# Patient Record
Sex: Female | Born: 1985 | Race: White | Hispanic: No | Marital: Single | Smoking: Former smoker
Health system: Southern US, Community
[De-identification: ages and names within clinical notes are randomized; demographics above are authoritative.]

## PROBLEM LIST (undated history)

## (undated) DIAGNOSIS — Z8619 Personal history of other infectious and parasitic diseases: Secondary | ICD-10-CM

## (undated) DIAGNOSIS — R87619 Unspecified abnormal cytological findings in specimens from cervix uteri: Secondary | ICD-10-CM

## (undated) DIAGNOSIS — B977 Papillomavirus as the cause of diseases classified elsewhere: Secondary | ICD-10-CM

## (undated) DIAGNOSIS — F32A Depression, unspecified: Secondary | ICD-10-CM

## (undated) DIAGNOSIS — R51 Headache: Secondary | ICD-10-CM

## (undated) DIAGNOSIS — F329 Major depressive disorder, single episode, unspecified: Secondary | ICD-10-CM

## (undated) DIAGNOSIS — IMO0002 Reserved for concepts with insufficient information to code with codable children: Secondary | ICD-10-CM

## (undated) DIAGNOSIS — F419 Anxiety disorder, unspecified: Secondary | ICD-10-CM

## (undated) DIAGNOSIS — K649 Unspecified hemorrhoids: Secondary | ICD-10-CM

## (undated) DIAGNOSIS — E119 Type 2 diabetes mellitus without complications: Secondary | ICD-10-CM

## (undated) DIAGNOSIS — Z3490 Encounter for supervision of normal pregnancy, unspecified, unspecified trimester: Secondary | ICD-10-CM

## (undated) DIAGNOSIS — K5900 Constipation, unspecified: Secondary | ICD-10-CM

## (undated) DIAGNOSIS — M5030 Other cervical disc degeneration, unspecified cervical region: Secondary | ICD-10-CM

## (undated) HISTORY — DX: Depression, unspecified: F32.A

## (undated) HISTORY — DX: Encounter for supervision of normal pregnancy, unspecified, unspecified trimester: Z34.90

## (undated) HISTORY — DX: Type 2 diabetes mellitus without complications: E11.9

## (undated) HISTORY — DX: Unspecified hemorrhoids: K64.9

## (undated) HISTORY — DX: Anxiety disorder, unspecified: F41.9

## (undated) HISTORY — DX: Major depressive disorder, single episode, unspecified: F32.9

## (undated) HISTORY — PX: NO PAST SURGERIES: SHX2092

## (undated) HISTORY — DX: Reserved for concepts with insufficient information to code with codable children: IMO0002

## (undated) HISTORY — DX: Headache: R51

## (undated) HISTORY — DX: Constipation, unspecified: K59.00

## (undated) HISTORY — DX: Unspecified abnormal cytological findings in specimens from cervix uteri: R87.619

## (undated) HISTORY — DX: Papillomavirus as the cause of diseases classified elsewhere: B97.7

## (undated) HISTORY — DX: Personal history of other infectious and parasitic diseases: Z86.19

---
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
## 2004-08-11 ENCOUNTER — Emergency Department (HOSPITAL_COMMUNITY): Admission: EM | Admit: 2004-08-11 | Discharge: 2004-08-11 | Payer: Self-pay | Admitting: Emergency Medicine

## 2005-11-05 ENCOUNTER — Emergency Department (HOSPITAL_COMMUNITY): Admission: EM | Admit: 2005-11-05 | Discharge: 2005-11-06 | Payer: Self-pay | Admitting: Emergency Medicine

## 2005-11-07 ENCOUNTER — Emergency Department (HOSPITAL_COMMUNITY): Admission: EM | Admit: 2005-11-07 | Discharge: 2005-11-07 | Payer: Self-pay | Admitting: *Deleted

## 2006-02-15 ENCOUNTER — Ambulatory Visit: Payer: Self-pay | Admitting: *Deleted

## 2006-02-15 ENCOUNTER — Encounter: Payer: Self-pay | Admitting: Obstetrics & Gynecology

## 2006-05-04 ENCOUNTER — Ambulatory Visit: Payer: Self-pay | Admitting: Obstetrics and Gynecology

## 2006-07-19 ENCOUNTER — Ambulatory Visit: Payer: Self-pay | Admitting: Obstetrics & Gynecology

## 2006-10-06 ENCOUNTER — Ambulatory Visit: Payer: Self-pay | Admitting: Family Medicine

## 2006-12-01 ENCOUNTER — Emergency Department (HOSPITAL_COMMUNITY): Admission: EM | Admit: 2006-12-01 | Discharge: 2006-12-01 | Payer: Self-pay | Admitting: Emergency Medicine

## 2006-12-29 ENCOUNTER — Encounter: Payer: Self-pay | Admitting: Family Medicine

## 2006-12-29 ENCOUNTER — Ambulatory Visit: Payer: Self-pay | Admitting: Family Medicine

## 2006-12-29 ENCOUNTER — Encounter (INDEPENDENT_AMBULATORY_CARE_PROVIDER_SITE_OTHER): Payer: Self-pay | Admitting: *Deleted

## 2007-02-15 ENCOUNTER — Ambulatory Visit: Payer: Self-pay | Admitting: *Deleted

## 2007-02-15 ENCOUNTER — Encounter: Payer: Self-pay | Admitting: Obstetrics & Gynecology

## 2007-02-15 ENCOUNTER — Other Ambulatory Visit: Admission: RE | Admit: 2007-02-15 | Discharge: 2007-02-15 | Payer: Self-pay | Admitting: Obstetrics & Gynecology

## 2007-02-23 ENCOUNTER — Emergency Department (HOSPITAL_COMMUNITY): Admission: EM | Admit: 2007-02-23 | Discharge: 2007-02-23 | Payer: Self-pay | Admitting: Emergency Medicine

## 2007-03-01 ENCOUNTER — Ambulatory Visit: Payer: Self-pay | Admitting: Obstetrics and Gynecology

## 2007-09-13 HISTORY — PX: COLPOSCOPY: SHX161

## 2008-06-23 ENCOUNTER — Emergency Department (HOSPITAL_COMMUNITY): Admission: EM | Admit: 2008-06-23 | Discharge: 2008-06-23 | Payer: Self-pay | Admitting: Emergency Medicine

## 2008-07-01 ENCOUNTER — Other Ambulatory Visit: Admission: RE | Admit: 2008-07-01 | Discharge: 2008-07-01 | Payer: Self-pay | Admitting: Unknown Physician Specialty

## 2008-07-01 ENCOUNTER — Encounter (INDEPENDENT_AMBULATORY_CARE_PROVIDER_SITE_OTHER): Payer: Self-pay | Admitting: Unknown Physician Specialty

## 2008-09-08 LAB — US OB TRANSVAGINAL

## 2008-12-15 LAB — US OB COMP + 14 WK

## 2010-03-04 ENCOUNTER — Emergency Department (HOSPITAL_COMMUNITY): Admission: EM | Admit: 2010-03-04 | Discharge: 2010-03-04 | Payer: Self-pay | Admitting: Emergency Medicine

## 2010-09-23 LAB — US OB COMP + 14 WK

## 2010-12-09 LAB — US OB FOLLOW UP

## 2011-01-28 NOTE — H&P (Signed)
[facility]  Patient:    MADISIN, HASAN Visit Number: [id number] MRN: [id number]          Service Type: OBS Location: 4A A415 01 Attending Physician:  Jeri Cos. Dictated by:   Christin Bach, M.D. Admit Date:  11/09/2001 Discharge Date: 11/09/2001   CC:         Langley Gauss, M.D.   History and Physical  OBSERVATION NOTE  PERFORMED: November 09, 2001  ADMITTED:  2:49 p.m.  DISCHARGED:  4:49 p.m.  OBSERVATION TIME:  Two hours.  CHIEF COMPLAINT:  Back pain, mild dysuria, questionable wet panties.  HISTORY OF PRESENT ILLNESS:  A 25 year old gravida 2, para 1 followed in Dr. Ferne Coe office for pregnancy care, now in second trimester who presents after calling in complaining of back discomfort and a single episode of wetting herself this a.m., question of membrane rupture, unable to be clarified further. The patient presents where an external monitoring shows an excellent reactive fetal heart rate tracing and no evidence of uterine contractions. Nitrazine test is negative. Back exam shows nonspecific musculoskeletal discomfort equal in all areas of the back from rib cage to posterior superior iliac crest. There is no specific CVA tenderness. Urinalysis on dipstick shows trace leukocytes, is negative for nitrites, protein, blood, etc.  PLAN: 1. Treat nonspecific musculoskeletal discomfort with Tylenol No. 3 x 15    tablets. The patient given prescription. 2. Prescription Macrobid 100 b.i.d. x 7 days. 3. Follow-up urine C&S which is sent to lab. Dictated by:   Christin Bach, M.D. Attending Physician:  Jeri Cos. DD:  11/09/01 TD:  11/09/01 Job: [id number]

## 2011-01-28 NOTE — Discharge Summary (Signed)
[facility]  Patient:    DENNETTE, FAULCONER Visit Number: [id number] MRN: [id number]          Service Type: OBS Location: 4A A427 01 Attending Physician:  Jeri Cos. Dictated by:   Langley Gauss, M.D. Admit Date:  12/31/2001 Discharge Date: 01/01/2002   CC:         [facility]   Discharge Summary  DIAGNOSES: 1. A 38 1/2 week intrauterine pregnancy. 2. Spontaneous rupture of membranes presenting in spontaneous labor.  PROCEDURES PERFORMED: 1. Spontaneous assisted vaginal delivery of 7 pounds 13 ounces female infant. 2. Midline episiotomy repair.  DISPOSITION:  Patient is given a prescription for Tylenol No.3 at time of discharge.  She is bottle feeding.  She will return visit in four weeks time to discuss birth control options.  LABORATORIES:  Hemoglobin and hematocrit admission of 10.1/27.9, postpartum day #1 10.1/25.0 with a white count of 16.9.  HOSPITAL COURSE:  Patient presented the early a.m. of December 31, 2001 in the early stages of active labor.  Initial examination by the nursing staff report was that patient was 4-5 cm dilated.  After admission performed patient requested epidural analgesic.  However, when I presented to place the epidural and evaluate the patient she was noted to be 9 cm dilated, completely effaced, 0 station, vertex presentation.  Patient began having an urge to push.  Thus, she was placed in the dorsal lithotomy position, prepped and draped in the usual sterile manner.  Patient pushed well during a short second stage of labor.  Lidocaine 20 cc 1% used in the midline perineal body.  Small midline episiotomy was performed.  The infant was then noted to deliver in a well controlled atraumatic manner in a direct OA position over this midline episiotomy without extension.  Mouth and nares of the infant were bulb suctioned of clear amniotic fluid.  Renewed expulsive efforts resulted in a spontaneous  rotation to a left anterior shoulder position.  Delivery itself was uncomplicated on December 31, 2001.  Patient did well postpartum with no postpartum complications.  She remained afebrile.  Bonded well with the infant and was discharged to home on January 01, 2002.  Patient is given a copy of the standardized discharge instructions at time of discharge.  Pediatric care was provided by [facility]. Dictated by:   Langley Gauss, M.D. Attending Physician:  Jeri Cos. DD:  01/03/02 TD:  01/04/02 Job: [id number]

## 2011-01-28 NOTE — Op Note (Signed)
[facility]  Patient:    Lynn Spencer, Lynn Spencer Visit Number: [id number] MRN: [id number]          Service Type: OBS Location: 4A A427 01 Attending Physician:  Jeri Cos. Dictated by:   Langley Gauss, M.D. Proc. Date: 12/31/01 Admit Date:  12/31/2001                             Operative Report  DELIVERY NOTE  DIAGNOSES:  1. 38 1/2 week intrauterine pregnancy.  2. Spontaneous rupture of membranes.  PROCEDURE:  1. Spontaneous cystovaginal delivery 6 pound 14 ounce female infant.  2. Midline episiotomy repair.  DELIVERY PERFORMED BY:  Langley Gauss, M.D.  COMPLICATIONS:  None.  SPECIMENS:  Arterial cord gas and cord blood to pathology. The placenta is examined and noted to be apparently intact with a three vessel umbilical cord.  ANALGESIA FOR DELIVERY:  The patient received IV narcotics only.  SUMMARY:  The patient is a gravida 2, para 1 who presented to [facility] with spontaneous rupture of membranes and noted to be in active labor. At initial examination by the nursing staff, she was reported to be 4-5 cm dilated and actively contracting with obvious evidence of spontaneous rupture of membranes. The patient was noted to have a reassuring fetal heart rate and regular uterine contractions. After appropriate admission procedures performed, request was made for epidural analgesic. When I arrived to place the epidural, the patient was having significant pelvic and perineal pressure at that time. She was examined by the nursing staff and noted to be 8 cm dilated. The patient has a history of very short second stage of labor with previous labor and delivery, thus it was felt that placement of the epidural could possibly interfere with the delivery; thus the patient was followed very closely with preparations for which was felt might be a very precipitous delivery. The patient thereafter did reach 9+ cm dilatation and she had  a strong urge to push. However, despite the excellent coaching efforts by the nursing staff as well as her mother, the patient was noted to be a very poor pushing candidate with very poor and ineffectual efforts frequently screaming and being somewhat antagonist and counter productive during the second stage of labor. The patient, however, was managed aggressively in attempts to get her to push. There was a point in time with the vertex at a +2 station and ineffectual pushing efforts, we planned on placing the epidural analgesic which may have facilitated the second stage of labor and allowed a vacuum assisted delivery as needed. However just as plans were being made to proceed with this placement, the patient began to push very well resulting in crowning of the infants vertex on the perineum. Thus she was placed in the dorsal lithotomy position, prepped and draped in the usual sterile manner, 10 cc of 1% lidocaine injected in the midline of the perineal body. With distention of the perineum, a small midline episiotomy was performed. The infant is then noted to deliver in a well controlled direct OA position over this midline episiotomy without extension. The mouth and nares of the infant were bulb suctioned of clear amniotic fluid. Rate of expulsive efforts resolve spontaneous rotation to a left anterior shoulder position. Combined pushing efforts with gentle abdominal traction resulted in delivery of the shoulder and pubic symphysis without difficulty. The umbilical cord was then milked towards the infant, cord was clamped and cut  and infant was placed on the maternal abdomen for immediate bonding purposes. Arterial cord gas and cord blood are obtained from the umbilical cord. Gentle traction of the umbilical cord results in separation which upon examination appears to be an intact placenta with a three vessel umbilical cord. Examination of the genital tract reveals two small superficial  periurethral lacerations bilaterally which were not bleeding and thus did not require repair. The midline episiotomy is noted to have not extended. The midline episiotomy is easily repaired utilizing #0 chromic in a running locked fashion on the vaginal mucosa followed by two layer closure of #0 chromic on the perineal body. The patient tolerated this repair very well. She did receive additional lidocaine such that a total of 30 cc of 1% lidocaine was utilized in the perineal body for episiotomy and episiotomy repair. The patient was then taken out of the dorsal lithotomy position and allowed to bond with the infant. The mother and infant both doing very well following delivery. Total estimated blood loss was less than 500 cc. Dictated by:   Langley Gauss, M.D. Attending Physician:  Jeri Cos. DD:  12/31/01 TD:  01/01/02 Job: [phone] [id number]

## 2011-01-28 NOTE — H&P (Signed)
[facility]  Patient:    Lynn Spencer, Lynn Spencer Visit Number: [id number] MRN: [id number]          Service Type: OBS Location: 4A A427 01 Attending Physician:  Jeri Cos. Dictated by:   Langley Gauss, M.D. Admit Date:  12/31/2001 Discharge Date: 01/01/2002                           History and Physical  HISTORY OF PRESENT ILLNESS:  [age]-year-old gravida 2, para 1 at 38-1/[redacted] weeks gestation, who presents to [facility] in early a.m. complaining of spontaneous rupture of membranes at home in bed with findings of clear amniotic fluid.  The patient presented to labor and delivery in obvious active labor.  The patients prenatal course has been uncomplicated. She has A-positive blood type.  AFP was normal.  GTT was normal during her previous pregnancy, thus was not repeated during this pregnancy.  OBSTETRICAL HISTORY:  Pertinent for vaginal delivery x1, 6-pound 7-ounce female infant, utilizing epidural analgesic.  That delivery was complicated only by a fourth degree extension of the midline episiotomy which was repaired appropriately and has resulted in no residual functional or anatomic defect.  DRUG ALLERGIES:  No known drug allergies.  CURRENT MEDICATIONS:  Prenatal vitamins only.  PHYSICAL EXAMINATION:  GENERAL:  A pleasant white female.  Height 5 foot 6 inches.  Weight is 146 pounds.  VITAL SIGNS:  Blood pressure 130/89, pulse rate of 98, respiratory rate is 20.  HEENT:  Negative.  No adenopathy.  NECK:  Supple.  Thyroid is nonpalpable.  LUNGS:  Clear.  CARDIOVASCULAR:  Regular rate and rhythm.  ABDOMEN:  Soft and nontender.  No surgical scars are identified.  She is vertex presentation by Leopolds maneuvers with a fundal height of 36 cm. External fetal monitor reveals uterine contractions occurring about every three minutes, moderate intensity.  Reassuring fetal heart rate is identified.  EXTREMITIES:  Noted to be  normal.  PELVIC:  Normal external genitalia.  No lesions or ulcerations identified. There is clear amniotic fluid pooling and leakage noted vaginally.  Sterile digital examination, initial examination by the nursing staff, 4- to 5-cm dilated, vertex presentation.  After laboring very rapidly and in very strong labor, the patient was examined and noted to be 9-cm dilated, completely effaced, 0 station, vertex presentation.  Clear amniotic fluid is noted.  ASSESSMENT:  Gravida 2, para 1, 38-1/[redacted] weeks gestation with very rapid progression from 4 to 5 cm to 9-cm dilatation.  Patient admitted and will be prepared for imminent delivery.  The patient had required epidural analgesic, however, at this advanced stage of labor, it is felt that placement of the epidural may be impossible as delivery may occur before epidural placement completed. Dictated by:   Langley Gauss, M.D. Attending Physician:  Jeri Cos. DD:  01/03/02 TD:  01/04/02 Job: [id number]

## 2011-01-28 NOTE — Consult Note (Signed)
[facility]  Patient:    Lynn Spencer, Lynn Spencer Visit Number: [id number] MRN: [id number]          Service Type: OBS Location: 4A A415 01 Attending Physician:  Jeri Cos. Dictated by:   Duane Lope, M.D. Proc. Date: 10/07/01 Admit Date:  10/07/2001 Discharge Date: 10/07/2001                            Consultation Report  OBSERVATION NOTE  DATE OF BIRTH:  1986-02-05  HISTORY OF PRESENT ILLNESS:  The patient is a 25 year old female, gravida 2, para 1, patient of Dr. Ferne Coe, who called earlier today complaining of some pelvic pressure and questionable contractions.  I told her to go in for evaluation.  It did take her a little while to get in, but when she arrived, she maintained the same complaints, also a little shortness of breath and nervousness.  HOSPITAL COURSE:  She came in and had a reassuring fetal heart rate strip which had areas of reactivity and very reassuring for 27 weeks.  Her cervix was long, thick, and closed with no presenting part in the vagina.  She remained on the monitor for over an hour and was found to have no uterine activity.  Her pulse oxygen in the hospital was 98%, and she seemed very comfortable.  All of her vital signs were stable, and she did not complain of any contractions.  She did have a yellowish-greenish discharge.  She had bacterial vaginosis early in the pregnancy, but it had no odor and was asymptomatic to her.  As a result, after monitoring and finding there was nothing going on with the pregnancy, she was discharged to home to follow up for a routine appointment in Dr. Ferne Coe office.  If she has any difficulty thereafter, she is encouraged to contact his office, or I will see her at the hospital. Dictated by:   Duane Lope, M.D. Attending Physician:  Jeri Cos. DD:  10/08/01 TD:  10/08/01 Job: [id number]

## 2011-01-28 NOTE — Group Therapy Note (Signed)
NAMELOYE, REININGER              ACCOUNT NO.:  [id number]   MEDICAL RECORD NO.:  [id number]          PATIENT TYPE:  WOC   LOCATION:  [facility]                   FACILITY:  [facility]   PHYSICIAN:  Ellis Parents, MD    DATE OF BIRTH:  [birth year]   DATE OF SERVICE:  02/15/2006                                    CLINIC NOTE   This 25 year old, gravida 3, para 2, abortus 1, comes in for a Pap smear and  contraceptive counseling.  The patient had a therapeutic abortion  approximately 2-1/2 weeks ago at which time she was 11-1/[redacted] weeks gestation.  She has had no post abortion complications.  The patient desires Depo-  Provera for contraception.   PHYSICAL EXAMINATION:  External genitalia are normal.  The vagina contains a  minimal amount of blood.  The cervix is clean.  The uterus is anterior,  normal in size and nontender.  Both adnexa are soft without tenderness,  masses or nodularity.   Pap smear was taken.  The patient is given Depo-Provera 150 mg IM for  contraception and advised to return in three months for a repeat Depo-  Provera.           ______________________________  Ellis Parents, MD     SA/MEDQ  D:  02/15/2006  T:  02/16/2006  Job:  [id number]

## 2011-01-28 NOTE — Group Therapy Note (Signed)
Lynn Spencer, Lynn Spencer              ACCOUNT NO.:  [id number]   MEDICAL RECORD NO.:  [id number]          PATIENT TYPE:  WOC   LOCATION:  [facility]                   FACILITY:  [facility]   PHYSICIAN:  Tinnie Gens, MD        DATE OF BIRTH:  08-11-1986   DATE OF SERVICE:                                  CLINIC NOTE   CHIEF COMPLAINT:  Yearly exam.   HISTORY OF PRESENT ILLNESS:  The patient is a 25 year old gravida 3,  para 2, AB1 who has a history of being on Depo-Provera.  She has very  scant bleeding occasionally in the 74-month cycle of being on Depo but  not enough to bother her.  The patient complains today of a lump under  her right clavicle which seems to be getting larger over the last  several weeks.  This lump is very close to the skin and firm.  Additionally, she has several moles that have been changing color of  late.   PAST MEDICAL HISTORY:  Negative.   PAST SURGICAL HISTORY:  Negative.   OBSTETRICAL HISTORY:  G3, P2, one voluntary interruption of pregnancy.   GYNECOLOGIC HISTORY:  Negative.  She is on Depo.  She does have a  history of abnormal Pap approximately 4 years, ago but none recently.   MEDICATIONS:  She takes over-the-counter pain reliever for headaches.   ALLERGIES:  NONE KNOWN.   FAMILY HISTORY:  Diabetes, heart disease, and hypertension.   The patient does smoke approximately half pack per day.  She works as a  Child psychotherapist.  She drinks approximately three beers per week and occasional  marijuana use.  The patient continues to report fatigue no matter how  much sleeps she gets.   REVIEW OF SYSTEMS:  Positive as in the HPI, otherwise negative.   PHYSICAL EXAMINATION:  VITAL SIGNS:  As noted on the chart.  GENERAL:  She is a well-developed, well-nourished female in no acute  distress.    HEENT:  Normocephalic, atraumatic.  Sclerae anicteric.  NECK:  Supple.  Normal thyroid.  LUNGS:  Clear bilaterally.  CV:  Regular rate and rhythm without rubs, gallops,  murmurs.  ABDOMEN:  Soft, nontender, nondistended.  CHEST WALL:  She does have a small 0.5-cm x 0.25-cm very firm, mobile  mass under the right clavicle.  It does not feel cystic in nature and is  above where I would think her breast tissue would be.  SKIN:  There is one mole and on the left pubic are and one outside of  the left breast.  Both are raised and have abnormal pigmentation.  She  has multiple tattoos noted.  BREASTS:  Symmetric with everted nipples.  No masses.  No  supraclavicular or axillary adenopathy.  GU:  Normal external female genitalia.  Vagina is pink and rugated.  Cervix is parous without lesion.  Uterus is small, anteverted.  No  adnexal masses or tenderness.  EXTREMITIES:  No cyanosis, clubbing, or edema.  Distal pulses 2+.   IMPRESSION:  1. Yearly exam.  2. Undesired fertility.   PLAN:  1. Depo-Provera today.  2. Pap smear  today.  3. STD screen.  4. General surgery referral for a lump under her right clavicle as      well as a few atypical moles.           ______________________________  Tinnie Gens, MD     TP/MEDQ  D:  12/29/2006  T:  12/29/2006  Job:  [id number]

## 2011-02-02 LAB — US OB DETAIL + 14 WK

## 2011-02-11 LAB — US OB FOLLOW UP

## 2012-04-09 ENCOUNTER — Encounter (HOSPITAL_COMMUNITY): Payer: Self-pay | Admitting: *Deleted

## 2012-04-09 ENCOUNTER — Emergency Department (HOSPITAL_COMMUNITY): Payer: Medicaid Other

## 2012-04-09 ENCOUNTER — Emergency Department (HOSPITAL_COMMUNITY)
Admission: EM | Admit: 2012-04-09 | Discharge: 2012-04-09 | Disposition: A | Discharge status: Home / Self Care | Payer: Medicaid Other | Attending: Emergency Medicine | Admitting: Emergency Medicine

## 2012-04-09 DIAGNOSIS — F17200 Nicotine dependence, unspecified, uncomplicated: Secondary | ICD-10-CM | POA: Insufficient documentation

## 2012-04-09 DIAGNOSIS — M549 Dorsalgia, unspecified: Secondary | ICD-10-CM | POA: Insufficient documentation

## 2012-04-09 DIAGNOSIS — Z7982 Long term (current) use of aspirin: Secondary | ICD-10-CM | POA: Insufficient documentation

## 2012-04-09 MED ORDER — CYCLOBENZAPRINE HCL 5 MG PO TABS: 5.0000 mg | ORAL_TABLET | Freq: Three times a day (TID) | ORAL | Status: AC | PRN

## 2012-04-09 MED ORDER — NAPROXEN 500 MG PO TABS: 500.0000 mg | ORAL_TABLET | Freq: Two times a day (BID) | ORAL | Status: DC

## 2012-04-09 MED ORDER — IBUPROFEN 800 MG PO TABS
800.0000 mg | ORAL_TABLET | Freq: Once | ORAL | Status: AC
  Administered 2012-04-09: 800 mg via ORAL
  Filled 2012-04-09: qty 1

## 2012-04-09 MED ORDER — CYCLOBENZAPRINE HCL 10 MG PO TABS
10.0000 mg | ORAL_TABLET | Freq: Once | ORAL | Status: AC
  Administered 2012-04-09: 10 mg via ORAL
  Filled 2012-04-09: qty 1

## 2012-04-09 NOTE — ED Notes (Signed)
Pt c/o back pain and requesting x-rays and MRI. I notified the patient that she would have to be seen by MD and that he would decide what tests to order. Pt alert and oriented x 3. Skin warm and dry. Color pink. No acute distress.

## 2012-04-09 NOTE — ED Notes (Signed)
Pt states mid back pain "for years" have been seen before for the same but did not have xray. Pt states "feels like something is poking out" States pain has recently became worse.

## 2012-04-09 NOTE — ED Provider Notes (Signed)
History  This chart was scribed for Ward [name], MD by [name]. This patient was seen in room APA08/APA08 and the patient's care was started at 1:01PM.  CSN: [id number]  Arrival date & time 04/09/12  1140   First MD Initiated Contact with Patient 04/09/12 1301      Chief Complaint  Patient presents with  . Back Pain     Patient is a 26 y.o. female presenting with back pain. The history is provided by the patient. No language interpreter was used.  Back Pain  This is a chronic problem. The current episode started more than 1 week ago. The problem occurs constantly. The problem has not changed since onset.The pain is present in the thoracic spine. The pain does not radiate. Pertinent negatives include no abdominal pain and no dysuria. She has tried nothing for the symptoms.    Lynn Spencer is a 26 y.o. female who presents to the Emergency Department complaining of 5 to 6 years of gradual onset, non-changing, constant mid back pain described as sharp just below the shoulder blades after having 4th child. She denies any specific injury but states she has had several falls in the past. The pain is worse with certain positions, sudden movements, and heavy lifting (states she lifts her 50 pound child). She reports that she has been seen for the same before but did not have an x-ray done. She states that she is here today for evaluation, because she just got her medicaid card over the weekend. She denies numbness, weakness, urinary symptoms, abdominal pain, nausea, emesis and diarrhea as associated symptoms.  She denies chance of pregnancy stating that she does not have a sexual partner and her LNMP was 03/16/12. She does not have a h/o chronic medical conditions. She is a current everyday smoker and occasional alcohol user.  No PCP.  History reviewed. No pertinent past medical history.  History reviewed. No pertinent past surgical history.  No family history on file.  History    Substance Use Topics  . Smoking status: Current Everyday Smoker- 5 to 6 cigarettes a day  . Smokeless tobacco: Not on file  . Alcohol Use: Yes     Occ  on medicaid  unemployed   No OB history provided.  Review of Systems  HENT: Negative for neck pain.   Gastrointestinal: Negative for nausea, vomiting, abdominal pain and diarrhea.  Genitourinary: Negative for dysuria, frequency and hematuria.  Musculoskeletal: Positive for back pain.  Skin: Negative for rash.    Allergies  Dimetapp cold-allergy; Robitussin (alcohol free); and Tramadol  Home Medications   Current Outpatient Rx  Name Route Sig Dispense Refill  . ACETAMINOPHEN 500 MG PO TABS Oral Take 1,000 mg by mouth every 6 (six) hours as needed. For pain    . ASPIRIN-ACETAMINOPHEN-CAFFEINE 260-130-16 MG PO TABS Oral Take 1 Package by mouth as needed. For pain    . IBUPROFEN 100 MG PO CHEW Oral Chew 200 mg by mouth every 4 (four) hours as needed. For pain      Triage Vitals: BP 129/65  Pulse 85  Temp 98.2 F (36.8 C) (Oral)  Resp 20  Ht 5\' 6"  (1.676 m)  Wt 140 lb (63.504 kg)  BMI 22.60 kg/m2  SpO2 100%  LMP 03/16/2012  Vital signs normal    Physical Exam  Nursing note and vitals reviewed. Constitutional: She is oriented to person, place, and time. She appears well-developed and well-nourished.  Non-toxic appearance. She does not appear ill.  No distress.  HENT:  Head: Normocephalic and atraumatic.  Right Ear: External ear normal.  Left Ear: External ear normal.  Nose: Nose normal. No mucosal edema or rhinorrhea.  Mouth/Throat: Oropharynx is clear and moist and mucous membranes are normal. No dental abscesses or uvula swelling.  Eyes: Conjunctivae and EOM are normal. Pupils are equal, round, and reactive to light.  Neck: Normal range of motion and full passive range of motion without pain. Neck supple. No tracheal deviation present.  Cardiovascular: Normal rate, regular rhythm and normal heart sounds.  Exam  reveals no gallop and no friction rub.   No murmur heard. Pulmonary/Chest: Effort normal and breath sounds normal. No respiratory distress. She has no wheezes. She has no rhonchi. She has no rales. She exhibits no tenderness and no crepitus.  Abdominal: Soft. Normal appearance and bowel sounds are normal. She exhibits no distension. There is no tenderness. There is no rebound and no guarding.  Musculoskeletal: Normal range of motion. She exhibits tenderness. She exhibits no edema.       Tenderness along the mid thoracic spine to the mid lumbar, pain in same area with ROM to the left, no pain with ROM to the right, minor less severe pain with flexing back, no cervical spine tenderness  Neurological: She is alert and oriented to person, place, and time. She has normal strength. No cranial nerve deficit.  Skin: Skin is warm, dry and intact. No rash noted. No erythema. No pallor.  Psychiatric: She has a normal mood and affect. Her speech is normal and behavior is normal. Her mood appears not anxious.    ED Course  Procedures (including critical care time)   Medications  ibuprofen (ADVIL,MOTRIN) tablet 800 mg (800 mg Oral Given 04/09/12 1403)  cyclobenzaprine (FLEXERIL) tablet 10 mg (10 mg Oral Given 04/09/12 1403)    DIAGNOSTIC STUDIES: Oxygen Saturation is 100% on room air, normal by my interpretation.    COORDINATION OF CARE: 1:54PM-Discussed treatment plan which includes x-ray and pain medication with pt at bedside and pt agreed to plan. 3:29PM-Informed pt of negative radiology results. Discussed discharge plan with pt and pt agreed. Advised pt to avoid lifting her children up to avoid flare ups.    Labs Reviewed - No data to display Dg Thoracic Spine W/swimmers  04/09/2012  *RADIOLOGY REPORT*  Clinical Data: Back pain.  THORACIC SPINE - 2 VIEW + SWIMMERS  Comparison: None.  Findings: No acute bony abnormality.  Specifically, no fracture or malalignment.  No significant degenerative  disease.  IMPRESSION: Normal study.  Original Report Authenticated By: Cyndie Chime, M.D.   Dg Lumbar Spine Complete  04/09/2012  *RADIOLOGY REPORT*  Clinical Data: Back pain.  LUMBAR SPINE - COMPLETE 4+ VIEW  Comparison: None.  Findings: There are five lumbar-type vertebral bodies.  No fracture or malalignment.  Disc spaces well maintained.  SI joints are symmetric.  IMPRESSION: Normal study.  Original Report Authenticated By: Cyndie Chime, M.D.     1. Back pain     New Prescriptions   CYCLOBENZAPRINE (FLEXERIL) 5 MG TABLET    Take 1 tablet (5 mg total) by mouth 3 (three) times daily as needed for muscle spasms.   NAPROXEN (NAPROSYN) 500 MG TABLET    Take 1 tablet (500 mg total) by mouth 2 (two) times daily.    Plan discharge  Devoria Albe, MD, FACEP   MDM   I personally performed the services described in this documentation, which was scribed in my presence. The  recorded information has been reviewed and considered.    Ward [name], MD 04/09/12 1758

## 2012-09-12 NOTE — L&D Delivery Note (Signed)
Attestation of Attending Supervision of Advanced Practitioner (PA/CNM/NP): Evaluation and management procedures were performed by the Advanced Practitioner under my supervision and collaboration.  I have reviewed the Advanced Practitioner's note and chart, and I agree with the management and plan.  Sylvi Rybolt, MD, FACOG Attending Obstetrician & Gynecologist Faculty Practice, [facility]  

## 2012-09-12 NOTE — L&D Delivery Note (Signed)
Delivery Note  After a one push second stage, At 0340 a viable female was delivered via  (Presentation: LOA ).  APGAR: 9/9 ; weight pending.  40 units of pitocin diluted in 1000cc LR was infused rapidly IV.  The placenta separated spontaneously and delivered via CCT and maternal pushing effort.  It was inspected and appears to be intact with a 3 VC.  There were the following complications: none  Anesthesia: Epidural  Episiotomy: none Lacerations: none Suture Repair: n/a Est. Blood Loss (mL): 100  Delivery by J. Pior, MD under my supervision  Mom to postpartum.  Baby to Couplet care / Skin to Skin.

## 2013-01-15 ENCOUNTER — Encounter (HOSPITAL_COMMUNITY): Payer: Self-pay | Admitting: *Deleted

## 2013-01-15 ENCOUNTER — Emergency Department (HOSPITAL_COMMUNITY)
Admission: EM | Admit: 2013-01-15 | Discharge: 2013-01-15 | Disposition: A | Discharge status: Home / Self Care | Payer: Medicaid Other | Attending: Emergency Medicine | Admitting: Emergency Medicine

## 2013-01-15 DIAGNOSIS — M545 Low back pain, unspecified: Secondary | ICD-10-CM | POA: Insufficient documentation

## 2013-01-15 DIAGNOSIS — O2390 Unspecified genitourinary tract infection in pregnancy, unspecified trimester: Secondary | ICD-10-CM | POA: Insufficient documentation

## 2013-01-15 DIAGNOSIS — R300 Dysuria: Secondary | ICD-10-CM | POA: Insufficient documentation

## 2013-01-15 DIAGNOSIS — O209 Hemorrhage in early pregnancy, unspecified: Secondary | ICD-10-CM | POA: Insufficient documentation

## 2013-01-15 DIAGNOSIS — Z3490 Encounter for supervision of normal pregnancy, unspecified, unspecified trimester: Secondary | ICD-10-CM

## 2013-01-15 DIAGNOSIS — N390 Urinary tract infection, site not specified: Secondary | ICD-10-CM | POA: Insufficient documentation

## 2013-01-15 LAB — URINALYSIS, ROUTINE W REFLEX MICROSCOPIC
Bilirubin Urine: NEGATIVE
Glucose, UA: NEGATIVE mg/dL
Specific Gravity, Urine: 1.025 (ref 1.005–1.030)
Urobilinogen, UA: 0.2 mg/dL (ref 0.0–1.0)
pH: 6 (ref 5.0–8.0)

## 2013-01-15 LAB — CBC WITH DIFFERENTIAL/PLATELET
Basophils Absolute: 0 10*3/uL (ref 0.0–0.1)
HCT: 35.3 % — ABNORMAL LOW (ref 36.0–46.0)
Lymphocytes Relative: 13 % (ref 12–46)
Lymphs Abs: 1.6 10*3/uL (ref 0.7–4.0)
MCV: 87.8 fL (ref 78.0–100.0)
Monocytes Absolute: 1.2 10*3/uL — ABNORMAL HIGH (ref 0.1–1.0)
Neutro Abs: 9.7 10*3/uL — ABNORMAL HIGH (ref 1.7–7.7)
RBC: 4.02 MIL/uL (ref 3.87–5.11)
RDW: 11.3 % — ABNORMAL LOW (ref 11.5–15.5)
WBC: 12.5 10*3/uL — ABNORMAL HIGH (ref 4.0–10.5)

## 2013-01-15 LAB — URINE MICROSCOPIC-ADD ON

## 2013-01-15 LAB — PREGNANCY, URINE: Preg Test, Ur: POSITIVE — AB

## 2013-01-15 LAB — HCG, QUANTITATIVE, PREGNANCY: hCG, Beta Chain, Quant, S: 35374 m[IU]/mL — ABNORMAL HIGH (ref <5)

## 2013-01-15 MED ORDER — LIDOCAINE HCL (PF) 1 % IJ SOLN
INTRAMUSCULAR | Status: AC
  Administered 2013-01-15: 22:00:00
  Filled 2013-01-15: qty 5

## 2013-01-15 MED ORDER — CEPHALEXIN 500 MG PO CAPS: 500.0000 mg | ORAL_CAPSULE | Freq: Four times a day (QID) | ORAL | Status: DC

## 2013-01-15 MED ORDER — OXYCODONE-ACETAMINOPHEN 5-325 MG PO TABS: 2.0000 | ORAL_TABLET | ORAL | Status: DC | PRN

## 2013-01-15 MED ORDER — CEFTRIAXONE SODIUM 1 G IJ SOLR
1.0000 g | Freq: Once | INTRAMUSCULAR | Status: AC
  Administered 2013-01-15: 1 g via INTRAMUSCULAR
  Filled 2013-01-15: qty 10

## 2013-01-15 MED ORDER — KETOROLAC TROMETHAMINE 60 MG/2ML IM SOLN
60.0000 mg | Freq: Once | INTRAMUSCULAR | Status: AC
  Administered 2013-01-15: 60 mg via INTRAMUSCULAR
  Filled 2013-01-15: qty 2

## 2013-01-15 NOTE — ED Notes (Signed)
Back pain since yesterday, no known injury.dysuria,    Recent UTI,  Felt she had a fever  Nausea, no vomiting.

## 2013-01-15 NOTE — ED Provider Notes (Signed)
History  This chart was scribed for Geoffery Lyons, MD by Shari Heritage, ED Scribe. The patient was seen in room APA17/APA17. Patient's care was started at 1944.   CSN: [id number]  Arrival date & time 01/15/13  1933   First MD Initiated Contact with Patient 01/15/13 1944      Chief Complaint  Patient presents with  . Back Pain    The history is provided by the patient. No language interpreter was used.    HPI Comments: Lynn Spencer is a 27 y.o. female who presents to the Emergency Department complaining of moderate to severe, non-radiating, diffuse thoracic and lumbar pain onset yesterday. Patient denies any obvious injury or trauma. She says that since yesterday, standing and walking has been difficult secondary to pain. She was treated for a UTI on 12/19/2012 with antibiotics. She reports that her urinary symptoms resolved until 1 week ago when she began having dysuria again. She denies fever, chills or vomiting. There is no weakness, numbness tingling or pain to the extremities.  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 She states that she has a history of recurrent back pain for the past 4 years and has had x-rays that are negative for serious degenerative changes. She says that she has back pain every day, but current flare up is much more severe. She does not take any medicines on a daily basis. She has no chronic medical conditions. She has no surgical history.    History reviewed. No pertinent past medical history.  History reviewed. No pertinent past surgical history.  History reviewed. No pertinent family history.  History  Substance Use Topics  . Smoking status: Current Every Day Smoker    Types: Cigarettes  . Smokeless tobacco: Not on file  . Alcohol Use: Yes     Comment: Occ    OB History   Grav Para Term Preterm Abortions TAB SAB Ect Mult Living                  Review of Systems A complete 10 system review of  systems was obtained and all systems are negative except as noted in the HPI and PMH.   Allergies  Dimetapp cold-allergy; Robitussin (alcohol free); and Tramadol  Home Medications   Current Outpatient Rx  Name  Route  Sig  Dispense  Refill  . acetaminophen (TYLENOL) 500 MG tablet   Oral   Take 1,000 mg by mouth every 6 (six) hours as needed. For pain         . Aspirin-Acetaminophen-Caffeine (GOODYS EXTRA STRENGTH) 260-130-16 MG TABS   Oral   Take 1 Package by mouth as needed. For pain         . ibuprofen (ADVIL,MOTRIN) 100 MG chewable tablet   Oral   Chew 200 mg by mouth every 4 (four) hours as needed. For pain         . naproxen (NAPROSYN) 500 MG tablet   Oral   Take 1 tablet (500 mg total) by mouth 2 (two) times daily.   30 tablet   0     Triage Vitals: BP 125/95  Pulse 117  Temp(Src) 97.2 F (36.2 C) (Oral)  Resp 18  Ht 5\' 6"  (1.676 m)  Wt 130 lb (58.968 kg)  BMI 20.99 kg/m2  SpO2 100%  LMP 12/16/2012  Physical Exam  Constitutional: She is oriented to person, place, and time. She appears well-developed and well-nourished.  HENT:  Head: Normocephalic and atraumatic.  Mouth/Throat: Oropharynx is clear and moist.  Eyes: Conjunctivae and EOM are normal. Pupils are equal, round, and reactive to light.  Neck: Normal range of motion. Neck supple.  Cardiovascular: Normal rate, regular rhythm and normal heart sounds.   No murmur heard. Pulmonary/Chest: Effort normal and breath sounds normal. No respiratory distress.  Abdominal: Soft. There is no tenderness.  Musculoskeletal: Normal range of motion.  Tenderness to palpation in the soft tissues of the lumbar region bilaterally. No bony tenderness or stepoffs.   Neurological: She is alert and oriented to person, place, and time.  DTRs are 3+ and equal in both patellar and achilles reflexes. Strength is 5/5 in bilateral lower extremities. Able to ambulate on her toes without difficulty.   Skin: Skin is warm and dry.  No rash noted.    ED Course  Procedures (including critical care time) DIAGNOSTIC STUDIES: Oxygen Saturation is 100% on room air, normal by my interpretation.    COORDINATION OF CARE: 7:55 PM- Patient informed of current plan for treatment and evaluation and agrees with plan at this time.     Labs Reviewed  URINALYSIS, ROUTINE W REFLEX MICROSCOPIC - Abnormal; Notable for the following:    APPearance CLOUDY (*)    Hgb urine dipstick SMALL (*)    Ketones, ur >80 (*)    Protein, ur 30 (*)    Nitrite POSITIVE (*)    Leukocytes, UA SMALL (*)    All other components within normal limits  PREGNANCY, URINE - Abnormal; Notable for the following:    Preg Test, Ur POSITIVE (*)    All other components within normal limits  URINE MICROSCOPIC-ADD ON - Abnormal; Notable for the following:    Squamous Epithelial / LPF FEW (*)    Bacteria, UA MANY (*)    All other components within normal limits  CBC WITH DIFFERENTIAL - Abnormal; Notable for the following:    WBC 12.5 (*)    HCT 35.3 (*)    RDW 11.3 (*)    Neutrophils Relative 78 (*)    Neutro Abs 9.7 (*)    Monocytes Absolute 1.2 (*)    All other components within normal limits  URINE CULTURE  HCG, QUANTITATIVE, PREGNANCY    No results found.   No diagnosis found.    MDM  The patient presents with dysuria, back pain.  UA shows a uti, however also pregnancy test is positive.  The Quant beta is 35k, but I doubt this is an ectopic.  She is having no abd pain or bleeding and the pregnancy appears to be more of an incidental finding.  I will bring her back in the AM for an ultrasound.  Treat tonight with rocephin, percocet.  Home with keflex.      I personally performed the services described in this documentation, which was scribed in my presence. The recorded information has been reviewed and is accurate.       Geoffery Lyons, MD 01/15/13 2208

## 2013-01-15 NOTE — ED Notes (Signed)
Patient has no signs of allergic reaction. No sob or distress noted or voiced

## 2013-01-16 ENCOUNTER — Ambulatory Visit (HOSPITAL_COMMUNITY)
Admit: 2013-01-16 | Discharge: 2013-01-16 | Disposition: A | Discharge status: Home / Self Care | Payer: Medicaid Other | Source: Ambulatory Visit | Attending: Emergency Medicine | Admitting: Emergency Medicine

## 2013-01-16 ENCOUNTER — Other Ambulatory Visit (HOSPITAL_COMMUNITY): Payer: Self-pay | Admitting: Emergency Medicine

## 2013-01-16 DIAGNOSIS — O99891 Other specified diseases and conditions complicating pregnancy: Secondary | ICD-10-CM | POA: Insufficient documentation

## 2013-01-16 DIAGNOSIS — R58 Hemorrhage, not elsewhere classified: Secondary | ICD-10-CM

## 2013-01-16 DIAGNOSIS — M549 Dorsalgia, unspecified: Secondary | ICD-10-CM | POA: Insufficient documentation

## 2013-01-17 LAB — URINE CULTURE: Colony Count: >=100000

## 2013-01-18 ENCOUNTER — Telehealth (HOSPITAL_COMMUNITY): Payer: Self-pay | Admitting: Emergency Medicine

## 2013-01-18 NOTE — ED Notes (Signed)
Post ED Visit - Positive Culture Follow-up ° °Culture report reviewed by antimicrobial stewardship pharmacist: °[] Wes Dulaney, Pharm.D., BCPS °[x] Jeremy Frens, Pharm.D., BCPS °[] Elizabeth Martin, Pharm.D., BCPS °[] Minh Pham, Pharm.D., BCPS, AAHIVP °[] Michelle Turner, Pharm.D., BCPS, AAHIV ° °Positive urine culture °Treated with keflex, organism sensitive to the same and no further patient follow-up is required at this time. ° °Lynn Spencer °01/18/2013, 2:22 PM ° ° °

## 2013-01-21 LAB — OB RESULTS CONSOLE TSH: TSH: 0.19

## 2013-01-21 LAB — OB RESULTS CONSOLE ANTIBODY SCREEN: Antibody Screen: NEGATIVE

## 2013-01-21 LAB — OB RESULTS CONSOLE GC/CHLAMYDIA: Chlamydia: NEGATIVE

## 2013-01-21 LAB — OB RESULTS CONSOLE RPR: RPR: NONREACTIVE

## 2013-01-21 LAB — OB RESULTS CONSOLE VARICELLA ZOSTER ANTIBODY, IGG: Varicella: IMMUNE

## 2013-01-21 LAB — OB RESULTS CONSOLE HGB/HCT, BLOOD: Hemoglobin: 12.6 g/dL

## 2013-01-21 LAB — OB RESULTS CONSOLE PLATELET COUNT: Platelets: 354 10*3/uL

## 2013-01-21 LAB — OB RESULTS CONSOLE HIV ANTIBODY (ROUTINE TESTING): HIV: NONREACTIVE

## 2013-05-16 ENCOUNTER — Encounter: Payer: Self-pay | Admitting: *Deleted

## 2013-05-16 DIAGNOSIS — F1990 Other psychoactive substance use, unspecified, uncomplicated: Secondary | ICD-10-CM | POA: Insufficient documentation

## 2013-05-27 ENCOUNTER — Other Ambulatory Visit (HOSPITAL_COMMUNITY)
Admission: RE | Admit: 2013-05-27 | Discharge: 2013-05-27 | Disposition: A | Discharge status: Home / Self Care | Payer: Medicaid Other | Source: Ambulatory Visit | Attending: Obstetrics and Gynecology | Admitting: Obstetrics and Gynecology

## 2013-05-27 ENCOUNTER — Ambulatory Visit (INDEPENDENT_AMBULATORY_CARE_PROVIDER_SITE_OTHER): Payer: Medicaid Other | Admitting: Adult Health

## 2013-05-27 ENCOUNTER — Encounter: Payer: Self-pay | Admitting: Adult Health

## 2013-05-27 VITALS — BP 102/70 | Wt 132.5 lb

## 2013-05-27 DIAGNOSIS — Z3482 Encounter for supervision of other normal pregnancy, second trimester: Secondary | ICD-10-CM

## 2013-05-27 DIAGNOSIS — K649 Unspecified hemorrhoids: Secondary | ICD-10-CM

## 2013-05-27 DIAGNOSIS — G8929 Other chronic pain: Secondary | ICD-10-CM

## 2013-05-27 DIAGNOSIS — O99340 Other mental disorders complicating pregnancy, unspecified trimester: Secondary | ICD-10-CM

## 2013-05-27 DIAGNOSIS — K5900 Constipation, unspecified: Secondary | ICD-10-CM

## 2013-05-27 DIAGNOSIS — O09299 Supervision of pregnancy with other poor reproductive or obstetric history, unspecified trimester: Secondary | ICD-10-CM

## 2013-05-27 DIAGNOSIS — Z01419 Encounter for gynecological examination (general) (routine) without abnormal findings: Secondary | ICD-10-CM | POA: Insufficient documentation

## 2013-05-27 DIAGNOSIS — Z3490 Encounter for supervision of normal pregnancy, unspecified, unspecified trimester: Secondary | ICD-10-CM

## 2013-05-27 DIAGNOSIS — F329 Major depressive disorder, single episode, unspecified: Secondary | ICD-10-CM

## 2013-05-27 DIAGNOSIS — Z331 Pregnant state, incidental: Secondary | ICD-10-CM

## 2013-05-27 DIAGNOSIS — Z1389 Encounter for screening for other disorder: Secondary | ICD-10-CM

## 2013-05-27 HISTORY — DX: Encounter for supervision of normal pregnancy, unspecified, unspecified trimester: Z34.90

## 2013-05-27 MED ORDER — HYDROCORTISONE ACE-PRAMOXINE 1-1 % RE CREA: TOPICAL_CREAM | Freq: Two times a day (BID) | RECTAL | Status: DC

## 2013-05-27 MED ORDER — ESCITALOPRAM OXALATE 10 MG PO TABS: 10.0000 mg | ORAL_TABLET | Freq: Every day | ORAL | Status: DC

## 2013-05-27 MED ORDER — SENNA 8.6 MG PO TABS: 1.0000 | ORAL_TABLET | Freq: Every day | ORAL | Status: DC

## 2013-05-27 NOTE — Progress Notes (Signed)
  Subjective:    Lynn Spencer is a 27 y.o. [phone] Caucasian female at [redacted]w[redacted]d by [place] being seen today for her first obstetrical visit.  Her obstetrical history is significant for depression.  Pregnancy history fully reviewed.   Patient reports backache that is chronic for 4-5 years, constipation and hemorrhoids, has depression,too.Rx senokot 1-2  Daily, rx anal pram HC and lexapro 10 mg 1 daily.  Filed Vitals:   05/27/13 1208  BP: 102/70  Weight: 132 lb 8 oz (60.102 kg)    HISTORY: OB History  Gravida Para Term Preterm AB SAB TAB Ectopic Multiple Living  6 4 3  1  1   4     # Outcome Date GA Lbr Len/2nd Weight Sex Delivery Anes PTL Lv  6 CUR           5 TRM 2012    F SVD EPI  Y  4 TRM 2010   7 lb 8.6 oz (3.419 kg) F SVD   Y  3 TAB 2007             Comments: Induced abortion   2 PAR 2003   7 lb 13.3 oz (3.552 kg) F SVD   Y  1 TRM 2001   6 lb 7 oz (2.92 kg) F SVD   Y     Past Medical History  Diagnosis Date  . Hx of gonorrhea   . Hx of chlamydia infection   . Hx of syphilis   . HPV (human papilloma virus) infection   . Abnormal Pap smear   . Anxiety   . Depression   . Headache(784.0)   . Hemorrhoids   . Constipation   . Pregnant 05/27/2013   Past Surgical History  Procedure Laterality Date  . Colposcopy  2009   Family History  Problem Relation Age of Onset  . Alcohol abuse Father   . Asthma Maternal Grandmother   . Diabetes Maternal Grandmother   . Hypertension Maternal Grandmother      Exam    Pelvic Exam:    Perineum: Normal Perineum   Vulva: normal   Vagina:  normal mucosa, normal discharge, no palpable nodules   Uterus   26 week size     Cervix: normal   Adnexa: Not palpable   Urinary: urethral meatus normal    System:     Skin: normal coloration and turgor, no rashes    Neurologic: oriented, normal mood   Extremities: normal strength, tone, and muscle mass   HEENT PERRLA   Mouth/Teeth mucous membranes moist   Cardiovascular: regular rate  and rhythm   Respiratory:  appears well, vitals normal, no respiratory distress, acyanotic, normal RR   Abdomen: soft, non-tender    Thin prep pap smear performed   Assessment:    Pregnancy: [id number] Patient Active Problem List   Diagnosis Date Noted  . Constipation 05/27/2013  . Hemorrhoids 05/27/2013  . Depression 05/27/2013  . Pregnant 05/27/2013  . Chronic back pain 05/27/2013  . Drug use 05/16/2013      [redacted]w[redacted]d [id number] New OB visit    Plan:      Continue prenatal vitamins Problem list reviewed and updated Reviewed recommended weight gain based on pre-gravid BMI Encouraged well-balanced diet Genetic Screening discussed Amniocentesis: declined Cystic fibrosis screening discussed declined Ultrasound discussed; fetal survey: undecided Follow up in 3 weeks for PN2 and see Dr Despina Hidden  Lynn Spencer,Lynn Spencer 05/27/2013 12:53 PM

## 2013-05-27 NOTE — Patient Instructions (Addendum)
Depression, Adult Depression refers to feeling sad, low, down in the dumps, blue, gloomy, or empty. In general, there are two kinds of depression: 1. Depression that we all experience from time to time because of upsetting life experiences, including the loss of a job or the ending of a relationship (normal sadness or normal grief). This kind of depression is considered normal, is short lived, and resolves within a few days to 2 weeks. (Depression experienced after the loss of a loved one is called bereavement. Bereavement often lasts longer than 2 weeks but normally gets better with time.) 2. Clinical depression, which lasts longer than normal sadness or normal grief or interferes with your ability to function at home, at work, and in school. It also interferes with your personal relationships. It affects almost every aspect of your life. Clinical depression is an illness. Symptoms of depression also can be caused by conditions other than normal sadness and grief or clinical depression. Examples of these conditions are listed as follows:  Physical illness Some physical illnesses, including underactive thyroid gland (hypothyroidism), severe anemia, specific types of cancer, diabetes, uncontrolled seizures, heart and lung problems, strokes, and chronic pain are commonly associated with symptoms of depression.  Side effects of some prescription medicine In some people, certain types of prescription medicine can cause symptoms of depression.  Substance abuse Abuse of alcohol and illicit drugs can cause symptoms of depression. SYMPTOMS Symptoms of normal sadness and normal grief include the following:  Feeling sad or crying for short periods of time.  Not caring about anything (apathy).  Difficulty sleeping or sleeping too much.  No longer able to enjoy the things you used to enjoy.  Desire to be by oneself all the time (social isolation).  Lack of energy or motivation.  Difficulty  concentrating or remembering.  Change in appetite or weight.  Restlessness or agitation. Symptoms of clinical depression include the same symptoms of normal sadness or normal grief and also the following symptoms:  Feeling sad or crying all the time.  Feelings of guilt or worthlessness.  Feelings of hopelessness or helplessness.  Thoughts of suicide or the desire to harm yourself (suicidal ideation).  Loss of touch with reality (psychotic symptoms). Seeing or hearing things that are not real (hallucinations) or having false beliefs about your life or the people around you (delusions and paranoia). DIAGNOSIS  The diagnosis of clinical depression usually is based on the severity and duration of the symptoms. Your caregiver also will ask you questions about your medical history and substance use to find out if physical illness, use of prescription medicine, or substance abuse is causing your depression. Your caregiver also may order blood tests. TREATMENT  Typically, normal sadness and normal grief do not require treatment. However, sometimes antidepressant medicine is prescribed for bereavement to ease the depressive symptoms until they resolve. The treatment for clinical depression depends on the severity of your symptoms but typically includes antidepressant medicine, counseling with a mental health professional, or a combination of both. Your caregiver will help to determine what treatment is best for you. Depression caused by physical illness usually goes away with appropriate medical treatment of the illness. If prescription medicine is causing depression, talk with your caregiver about stopping the medicine, decreasing the dose, or substituting another medicine. Depression caused by abuse of alcohol or illicit drugs abuse goes away with abstinence from these substances. Some adults need professional help in order to stop drinking or using drugs. SEEK IMMEDIATE CARE IF:  You have   thoughts  about hurting yourself or others.  You lose touch with reality (have psychotic symptoms).  You are taking medicine for depression and have a serious side effect. FOR MORE INFORMATION National [employer] on Mental Illness: www.nami.[employer]: http://www.maynard.net/ Document Released: 08/26/2000 Document Revised: 02/28/2012 Document Reviewed: 11/28/2011 [facility] Patient Information 2014 [place], [place]. Constipation, Adult Constipation is when a person has fewer than 3 bowel movements a week; has difficulty having a bowel movement; or has stools that are dry, hard, or larger than normal. As people grow older, constipation is more common. If you try to fix constipation with medicines that make you have a bowel movement (laxatives), the problem may get worse. Long-term laxative use may cause the muscles of the colon to become weak. A low-fiber diet, not taking in enough fluids, and taking certain medicines may make constipation worse. CAUSES   Certain medicines, such as antidepressants, pain medicine, iron supplements, antacids, and water pills.   Certain diseases, such as diabetes, irritable bowel syndrome (IBS), thyroid disease, or depression.   Not drinking enough water.   Not eating enough fiber-rich foods.   Stress or travel.  Lack of physical activity or exercise.  Not going to the restroom when there is the urge to have a bowel movement.  Ignoring the urge to have a bowel movement.  Using laxatives too much. SYMPTOMS   Having fewer than 3 bowel movements a week.   Straining to have a bowel movement.   Having hard, dry, or larger than normal stools.   Feeling full or bloated.   Pain in the lower abdomen.  Not feeling relief after having a bowel movement. DIAGNOSIS  Your caregiver will take a medical history and perform a physical exam. Further testing may be done for severe constipation. Some tests may include:   A barium enema X-ray to  examine your rectum, colon, and sometimes, your small intestine.  A sigmoidoscopy to examine your lower colon.  A colonoscopy to examine your entire colon. TREATMENT  Treatment will depend on the severity of your constipation and what is causing it. Some dietary treatments include drinking more fluids and eating more fiber-rich foods. Lifestyle treatments may include regular exercise. If these diet and lifestyle recommendations do not help, your caregiver may recommend taking over-the-counter laxative medicines to help you have bowel movements. Prescription medicines may be prescribed if over-the-counter medicines do not work.  HOME CARE INSTRUCTIONS   Increase dietary fiber in your diet, such as fruits, vegetables, whole grains, and beans. Limit high-fat and processed sugars in your diet, such as [place] fries, hamburgers, cookies, candies, and soda.   A fiber supplement may be added to your diet if you cannot get enough fiber from foods.   Drink enough fluids to keep your urine clear or pale yellow.   Exercise regularly or as directed by your caregiver.   Go to the restroom when you have the urge to go. Do not hold it.  Only take medicines as directed by your caregiver. Do not take other medicines for constipation without talking to your caregiver first. SEEK IMMEDIATE MEDICAL CARE IF:   You have bright red blood in your stool.   Your constipation lasts for more than 4 days or gets worse.   You have abdominal or rectal pain.   You have thin, pencil-like stools.  You have unexplained weight loss. MAKE SURE YOU:   Understand these instructions.  Will watch your condition.  Will get help right away if  you are not doing well or get worse. Document Released: 05/27/2004 Document Revised: 11/21/2011 Document Reviewed: 08/02/2011 [facility] Patient Information 2014 [place], [place]. Hemorrhoids Hemorrhoids are swollen veins around the rectum or anus. There are two types of  hemorrhoids:   Internal hemorrhoids. These occur in the veins just inside the rectum. They may poke through to the outside and become irritated and painful.  External hemorrhoids. These occur in the veins outside the anus and can be felt as a painful swelling or hard lump near the anus. CAUSES  Pregnancy.   Obesity.   Constipation or diarrhea.   Straining to have a bowel movement.   Sitting for long periods on the toilet.  Heavy lifting or other activity that caused you to strain.  Anal intercourse. SYMPTOMS   Pain.   Anal itching or irritation.   Rectal bleeding.   Fecal leakage.   Anal swelling.   One or more lumps around the anus.  DIAGNOSIS  Your caregiver may be able to diagnose hemorrhoids by visual examination. Other examinations or tests that may be performed include:   Examination of the rectal area with a gloved hand (digital rectal exam).   Examination of anal canal using a small tube (scope).   A blood test if you have lost a significant amount of blood.  A test to look inside the colon (sigmoidoscopy or colonoscopy). TREATMENT Most hemorrhoids can be treated at home. However, if symptoms do not seem to be getting better or if you have a lot of rectal bleeding, your caregiver may perform a procedure to help make the hemorrhoids get smaller or remove them completely. Possible treatments include:   Placing a rubber band at the base of the hemorrhoid to cut off the circulation (rubber band ligation).   Injecting a chemical to shrink the hemorrhoid (sclerotherapy).   Using a tool to burn the hemorrhoid (infrared light therapy).   Surgically removing the hemorrhoid (hemorrhoidectomy).   Stapling the hemorrhoid to block blood flow to the tissue (hemorrhoid stapling).  HOME CARE INSTRUCTIONS   Eat foods with fiber, such as whole grains, beans, nuts, fruits, and vegetables. Ask your doctor about taking products with added fiber in them  (fibersupplements).  Increase fluid intake. Drink enough water and fluids to keep your urine clear or pale yellow.   Exercise regularly.   Go to the bathroom when you have the urge to have a bowel movement. Do not wait.   Avoid straining to have bowel movements.   Keep the anal area dry and clean. Use wet toilet paper or moist towelettes after a bowel movement.   Medicated creams and suppositories may be used or applied as directed.   Only take over-the-counter or prescription medicines as directed by your caregiver.   Take warm sitz baths for 15 20 minutes, 3 4 times a day to ease pain and discomfort.   Place ice packs on the hemorrhoids if they are tender and swollen. Using ice packs between sitz baths may be helpful.   Put ice in a plastic bag.   Place a towel between your skin and the bag.   Leave the ice on for 15 20 minutes, 3 4 times a day.   Do not use a donut-shaped pillow or sit on the toilet for long periods. This increases blood pooling and pain.  SEEK MEDICAL CARE IF:  You have increasing pain and swelling that is not controlled by treatment or medicine.  You have uncontrolled bleeding.  You have difficulty  or you are unable to have a bowel movement.  You have pain or inflammation outside the area of the hemorrhoids. MAKE SURE YOU:  Understand these instructions.  Will watch your condition.  Will get help right away if you are not doing well or get worse. Document Released: 08/26/2000 Document Revised: 08/15/2012 Document Reviewed: 07/03/2012 [facility] Patient Information 2014 [place], [place]. PN2 in  3 weeks and see Dr Despina Hidden

## 2013-05-27 NOTE — Progress Notes (Signed)
New OB packet given. Consent signed. CCNC form filled out.

## 2013-05-28 ENCOUNTER — Telehealth: Payer: Self-pay | Admitting: Adult Health

## 2013-05-28 MED ORDER — FLUCONAZOLE 150 MG PO TABS: 150.0000 mg | ORAL_TABLET | Freq: Once | ORAL | Status: DC

## 2013-05-28 NOTE — Telephone Encounter (Signed)
Pt aware of pap and yeast will rx 1 diflucan

## 2013-06-18 ENCOUNTER — Encounter: Payer: Medicaid Other | Admitting: Obstetrics & Gynecology

## 2013-06-18 ENCOUNTER — Other Ambulatory Visit: Payer: Medicaid Other

## 2013-06-24 ENCOUNTER — Encounter: Payer: Self-pay | Admitting: Women's Health

## 2013-06-24 ENCOUNTER — Ambulatory Visit (INDEPENDENT_AMBULATORY_CARE_PROVIDER_SITE_OTHER): Payer: Medicaid Other | Admitting: Women's Health

## 2013-06-24 ENCOUNTER — Other Ambulatory Visit: Payer: Medicaid Other

## 2013-06-24 VITALS — BP 120/70 | Wt 134.0 lb

## 2013-06-24 DIAGNOSIS — O0990 Supervision of high risk pregnancy, unspecified, unspecified trimester: Secondary | ICD-10-CM | POA: Insufficient documentation

## 2013-06-24 DIAGNOSIS — F1290 Cannabis use, unspecified, uncomplicated: Secondary | ICD-10-CM

## 2013-06-24 DIAGNOSIS — O0993 Supervision of high risk pregnancy, unspecified, third trimester: Secondary | ICD-10-CM

## 2013-06-24 DIAGNOSIS — F329 Major depressive disorder, single episode, unspecified: Secondary | ICD-10-CM

## 2013-06-24 DIAGNOSIS — O09299 Supervision of pregnancy with other poor reproductive or obstetric history, unspecified trimester: Secondary | ICD-10-CM

## 2013-06-24 DIAGNOSIS — Z3483 Encounter for supervision of other normal pregnancy, third trimester: Secondary | ICD-10-CM

## 2013-06-24 DIAGNOSIS — O99340 Other mental disorders complicating pregnancy, unspecified trimester: Secondary | ICD-10-CM

## 2013-06-24 DIAGNOSIS — G8929 Other chronic pain: Secondary | ICD-10-CM | POA: Insufficient documentation

## 2013-06-24 LAB — CBC
Hemoglobin: 12.2 g/dL (ref 12.0–15.0)
MCH: 30.7 pg (ref 26.0–34.0)
RBC: 3.98 MIL/uL (ref 3.87–5.11)
RDW: 12.9 % (ref 11.5–15.5)
WBC: 14.1 10*3/uL — ABNORMAL HIGH (ref 4.0–10.5)

## 2013-06-24 NOTE — Progress Notes (Signed)
Reports good fm. Denies uc's, lof, vb, urinary frequency, urgency, hesitancy, or dysuria.  Chronic mid Lt back pain x 4-76yrs, taking percocet 5/325 prn, rx'd by ED MD, never had imaging per pt- had MRI scheduled but +PT. Today's visit was supposed to be w/ L[facility] per JAGs notes. Recommended pregnancy belt, heating pad x108min prn, warm baths, gave info on back pain exercises. H/O depression, rx'd lexapro by JAG last visit, but didn't get filled b/c her mom said it was too much. Pt to check on this, if too much, to call us back. Feels like she is doing fine not being on any meds, denies SI/HI. Reviewed ptl s/s, fkc, contraception choices-will continue to think about these.  All questions answered. PN2 today. F/U in 2wks for visit w/ LHE.

## 2013-06-24 NOTE — Patient Instructions (Signed)

## 2013-06-25 ENCOUNTER — Encounter: Payer: Self-pay | Admitting: Women's Health

## 2013-06-25 LAB — HIV ANTIBODY (ROUTINE TESTING W REFLEX): HIV: NONREACTIVE

## 2013-06-25 LAB — GLUCOSE TOLERANCE, 2 HOURS W/ 1HR
Glucose, 2 hour: 147 mg/dL — ABNORMAL HIGH (ref 70–139)
Glucose, Fasting: 83 mg/dL (ref 70–99)

## 2013-06-25 LAB — HSV 2 ANTIBODY, IGG: HSV 2 Glycoprotein G Ab, IgG: <0.1 IV

## 2013-06-25 LAB — RPR

## 2013-06-26 ENCOUNTER — Telehealth: Payer: Self-pay | Admitting: Women's Health

## 2013-06-26 ENCOUNTER — Telehealth: Payer: Self-pay | Admitting: *Deleted

## 2013-06-26 DIAGNOSIS — O24410 Gestational diabetes mellitus in pregnancy, diet controlled: Secondary | ICD-10-CM | POA: Insufficient documentation

## 2013-06-26 NOTE — Telephone Encounter (Signed)
Spoke with pt letting her know of abnormal sugar test. Lynn Spencer, CNM has sent order for dietician consult and included in order the need for diabetic testing supplies. Advised pt if she didn't get diabetic supplies from dietician, call us and let us know. Also, if she hasn't heard from dietician in 1 week, call us and let us know. Usually will see dietician in [place], but if pt has a problem going to [place], let dietician know and she can be seen in [place] per Lynn Spencer, CNM. JSY

## 2013-06-26 NOTE — Telephone Encounter (Signed)
Attempted to call pt to notify of abnormal 2hr gtt and need for cbg testing and dietician referral. Left message for her to call back. Referral placed in computer.  Cheral Marker, CNM, WHNP-BC 06/26/2013 9:22 AM

## 2013-07-03 ENCOUNTER — Telehealth: Payer: Self-pay | Admitting: Adult Health

## 2013-07-03 NOTE — Telephone Encounter (Signed)
Call transferred to Samule Dry to get info on dietician. JSY

## 2013-07-08 ENCOUNTER — Ambulatory Visit (INDEPENDENT_AMBULATORY_CARE_PROVIDER_SITE_OTHER): Payer: Medicaid Other | Admitting: Obstetrics & Gynecology

## 2013-07-08 ENCOUNTER — Encounter: Payer: Self-pay | Admitting: Obstetrics & Gynecology

## 2013-07-08 VITALS — BP 102/60 | Wt 133.0 lb

## 2013-07-08 DIAGNOSIS — Z1389 Encounter for screening for other disorder: Secondary | ICD-10-CM

## 2013-07-08 DIAGNOSIS — F1920 Other psychoactive substance dependence, uncomplicated: Secondary | ICD-10-CM

## 2013-07-08 DIAGNOSIS — O09299 Supervision of pregnancy with other poor reproductive or obstetric history, unspecified trimester: Secondary | ICD-10-CM

## 2013-07-08 DIAGNOSIS — O99340 Other mental disorders complicating pregnancy, unspecified trimester: Secondary | ICD-10-CM

## 2013-07-08 DIAGNOSIS — Z331 Pregnant state, incidental: Secondary | ICD-10-CM

## 2013-07-08 DIAGNOSIS — Z3483 Encounter for supervision of other normal pregnancy, third trimester: Secondary | ICD-10-CM

## 2013-07-08 LAB — POCT URINALYSIS DIPSTICK
Blood, UA: NEGATIVE
Glucose, UA: NEGATIVE
Ketones, UA: NEGATIVE

## 2013-07-08 NOTE — Progress Notes (Deleted)
C/C Back Pain.

## 2013-07-08 NOTE — Addendum Note (Signed)
Addended by: Richardson Chiquito on: 07/08/2013 10:23 AM   Modules accepted: Orders

## 2013-07-08 NOTE — Progress Notes (Signed)
BP weight and urine results all reviewed and noted. Patient reports good fetal movement, denies any bleeding and no rupture of membranes symptoms or regular contractions. Patient is without complaints. All questions were answered.  

## 2013-07-08 NOTE — Progress Notes (Signed)
Seeing the nutritionist in 2 days

## 2013-07-10 ENCOUNTER — Encounter: Payer: Medicaid Other | Attending: Obstetrics & Gynecology

## 2013-07-10 ENCOUNTER — Telehealth: Payer: Self-pay | Admitting: Obstetrics and Gynecology

## 2013-07-10 VITALS — Ht 65.0 in | Wt 134.3 lb

## 2013-07-10 DIAGNOSIS — Z713 Dietary counseling and surveillance: Secondary | ICD-10-CM | POA: Insufficient documentation

## 2013-07-10 DIAGNOSIS — O99810 Abnormal glucose complicating pregnancy: Secondary | ICD-10-CM | POA: Insufficient documentation

## 2013-07-10 DIAGNOSIS — O24410 Gestational diabetes mellitus in pregnancy, diet controlled: Secondary | ICD-10-CM

## 2013-07-10 NOTE — Telephone Encounter (Signed)
Spoke with pt. Has the Accucheck Nano machine. Checks sugar 4 times a day. Needs strips and lancets called into [facility] in [place]. Spoke with Drenda Freeze. She authorized strips and lancets with prn refills. JSY

## 2013-07-10 NOTE — Progress Notes (Signed)
  Patient was seen on 07/10/13 for Gestational Diabetes self-management class at [facility]. The following learning objectives were met by the patient during this course:   States the definition of Gestational Diabetes  States why dietary management is important in controlling blood glucose  Describes the effects each nutrient has on blood glucose levels  Demonstrates ability to create a balanced meal plan  Demonstrates carbohydrate counting   States when to check blood glucose levels  Demonstrates proper blood glucose monitoring techniques  States the effect of stress and exercise on blood glucose levels  States the importance of limiting caffeine and abstaining from alcohol and smoking  Blood glucose monitor given: Accu Chek Nano BG Monitoring Kit Lot # [id number] Exp: 08/11/2014 Blood glucose reading: 102 mg/dl  Patient instructed to monitor glucose levels: FBS: 60 - <90 2 hour: <120  *Patient received handouts:  Nutrition Diabetes and Pregnancy  Carbohydrate Counting List  Patient will be seen for follow-up as needed.

## 2013-07-18 ENCOUNTER — Other Ambulatory Visit: Payer: Self-pay

## 2013-07-22 ENCOUNTER — Encounter: Payer: Medicaid Other | Admitting: Women's Health

## 2013-07-23 ENCOUNTER — Ambulatory Visit (INDEPENDENT_AMBULATORY_CARE_PROVIDER_SITE_OTHER): Payer: Medicaid Other | Admitting: Obstetrics and Gynecology

## 2013-07-23 VITALS — BP 118/64 | Wt 136.6 lb

## 2013-07-23 DIAGNOSIS — O2390 Unspecified genitourinary tract infection in pregnancy, unspecified trimester: Secondary | ICD-10-CM

## 2013-07-23 DIAGNOSIS — N898 Other specified noninflammatory disorders of vagina: Secondary | ICD-10-CM

## 2013-07-23 DIAGNOSIS — Z3493 Encounter for supervision of normal pregnancy, unspecified, third trimester: Secondary | ICD-10-CM

## 2013-07-23 NOTE — Progress Notes (Signed)
CBG's 60-120 C/o mucus and white d/c x 2 wk.  KOH,, wet prep negative , pH 6.,  Cx long closed, vtx ballottable, some mucoid d/c felt part of multip cx mucus. A.: no evidence ptl,

## 2013-08-06 ENCOUNTER — Encounter: Payer: Medicaid Other | Admitting: Obstetrics and Gynecology

## 2013-08-12 ENCOUNTER — Other Ambulatory Visit: Payer: Self-pay | Admitting: Obstetrics and Gynecology

## 2013-08-12 ENCOUNTER — Ambulatory Visit (INDEPENDENT_AMBULATORY_CARE_PROVIDER_SITE_OTHER): Payer: Medicaid Other | Admitting: Obstetrics and Gynecology

## 2013-08-12 ENCOUNTER — Encounter: Payer: Self-pay | Admitting: Obstetrics and Gynecology

## 2013-08-12 VITALS — BP 110/60 | Wt 139.2 lb

## 2013-08-12 DIAGNOSIS — Z3480 Encounter for supervision of other normal pregnancy, unspecified trimester: Secondary | ICD-10-CM

## 2013-08-12 DIAGNOSIS — O0993 Supervision of high risk pregnancy, unspecified, third trimester: Secondary | ICD-10-CM

## 2013-08-12 DIAGNOSIS — O09299 Supervision of pregnancy with other poor reproductive or obstetric history, unspecified trimester: Secondary | ICD-10-CM

## 2013-08-12 DIAGNOSIS — O99340 Other mental disorders complicating pregnancy, unspecified trimester: Secondary | ICD-10-CM

## 2013-08-12 NOTE — Progress Notes (Signed)
Ms. Degrasse is a [id number] @ 36.4 wks who presents for prenatal visit today.  She is doing well without any complaints, denies any vaginal bleeding, discharge, abdominal pain, dysuria, hematuria.  Good fetal movement.  Filed Vitals:   08/12/13 1513  BP: 110/60    A/P [id number] @ 36.4 wks presenting for routine care.  Doing well, GBS obtained today, f/u in 1 wk.

## 2013-08-12 NOTE — Assessment & Plan Note (Deleted)
Ms. Serpas is 36.4 wks [id number] here for prenatal follow up today.  She has no complaints and denies any vaginal bleeding, vaginal discharge, hematuria, dysuria, abdominal pain, nausea, vomiting, fever, chills or sweats.  Has had good fetal movement.   Filed Vitals:   08/12/13 1513  BP: 110/60    A/P [id number] @ 36.4, GBS obtained today, f/u in one week.

## 2013-08-12 NOTE — Patient Instructions (Signed)
Fetal Movement Counts Patient Name: __________________________________________________ Patient Due Date: ____________________ Performing a fetal movement count is highly recommended in high-risk pregnancies, but it is good for every pregnant woman to do. Your caregiver may ask you to start counting fetal movements at 28 weeks of the pregnancy. Fetal movements often increase:  After eating a full meal.  After physical activity.  After eating or drinking something sweet or cold.  At rest. Pay attention to when you feel the baby is most active. This will help you notice a pattern of your baby's sleep and wake cycles and what factors contribute to an increase in fetal movement. It is important to perform a fetal movement count at the same time each day when your baby is normally most active.  HOW TO COUNT FETAL MOVEMENTS 1. Find a quiet and comfortable area to sit or lie down on your left side. Lying on your left side provides the best blood and oxygen circulation to your baby. 2. Write down the day and time on a sheet of paper or in a journal. 3. Start counting kicks, flutters, swishes, rolls, or jabs in a 2 hour period. You should feel at least 10 movements within 2 hours. 4. If you do not feel 10 movements in 2 hours, wait 2 3 hours and count again. Look for a change in the pattern or not enough counts in 2 hours. SEEK MEDICAL CARE IF:  You feel less than 10 counts in 2 hours, tried twice.  There is no movement in over an hour.  The pattern is changing or taking longer each day to reach 10 counts in 2 hours.  You feel the baby is not moving as he or she usually does. Date: ____________ Movements: ____________ Start time: ____________ Doreatha Martin time: ____________  Date: ____________ Movements: ____________ Start time: ____________ Doreatha Martin time: ____________ Date: ____________ Movements: ____________ Start time: ____________ Doreatha Martin time: ____________ Date: ____________ Movements: ____________  Start time: ____________ Doreatha Martin time: ____________ Date: ____________ Movements: ____________ Start time: ____________ Doreatha Martin time: ____________ Date: ____________ Movements: ____________ Start time: ____________ Doreatha Martin time: ____________ Date: ____________ Movements: ____________ Start time: ____________ Doreatha Martin time: ____________ Date: ____________ Movements: ____________ Start time: ____________ Doreatha Martin time: ____________  Date: ____________ Movements: ____________ Start time: ____________ Doreatha Martin time: ____________ Date: ____________ Movements: ____________ Start time: ____________ Doreatha Martin time: ____________ Date: ____________ Movements: ____________ Start time: ____________ Doreatha Martin time: ____________ Date: ____________ Movements: ____________ Start time: ____________ Doreatha Martin time: ____________ Date: ____________ Movements: ____________ Start time: ____________ Doreatha Martin time: ____________ Date: ____________ Movements: ____________ Start time: ____________ Doreatha Martin time: ____________ Date: ____________ Movements: ____________ Start time: ____________ Doreatha Martin time: ____________  Date: ____________ Movements: ____________ Start time: ____________ Doreatha Martin time: ____________ Date: ____________ Movements: ____________ Start time: ____________ Doreatha Martin time: ____________ Date: ____________ Movements: ____________ Start time: ____________ Doreatha Martin time: ____________ Date: ____________ Movements: ____________ Start time: ____________ Doreatha Martin time: ____________ Date: ____________ Movements: ____________ Start time: ____________ Doreatha Martin time: ____________

## 2013-08-13 LAB — GC/CHLAMYDIA PROBE AMP
CT Probe RNA: NEGATIVE
GC Probe RNA: NEGATIVE

## 2013-08-14 LAB — STREP B DNA PROBE: GBSP: NEGATIVE

## 2013-08-16 IMAGING — CR DG THORACIC SPINE 3V
4 series · 4 of 4 positions shown · non-contrast
Comparison: None.

CLINICAL DATA: Back pain.

THORACIC SPINE - 2 VIEW + SWIMMERS

[view not recorded (1 of 4)]
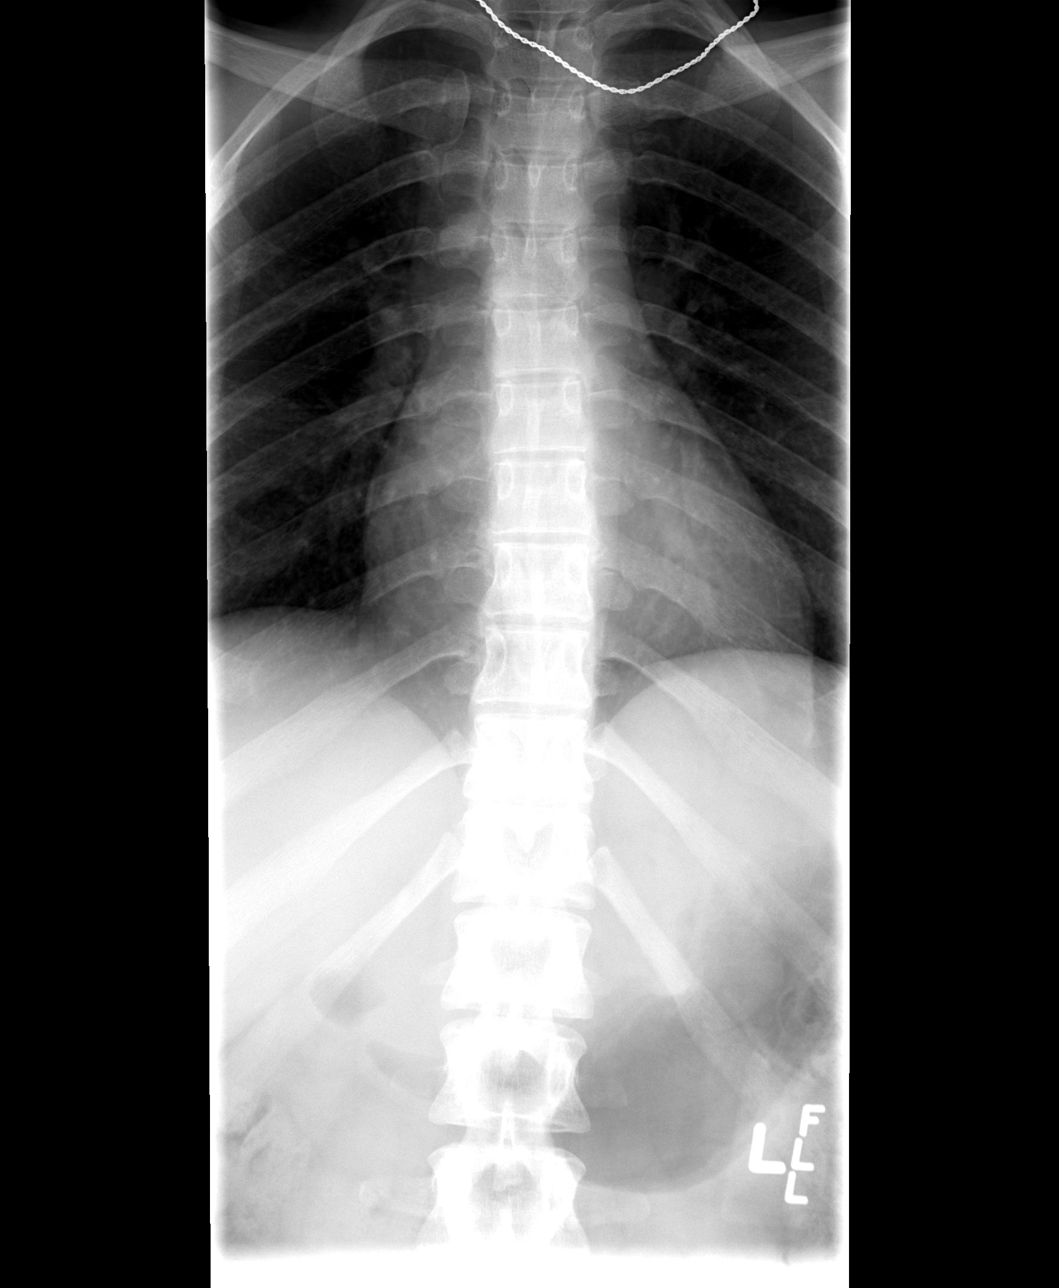

[view not recorded (2 of 4)]
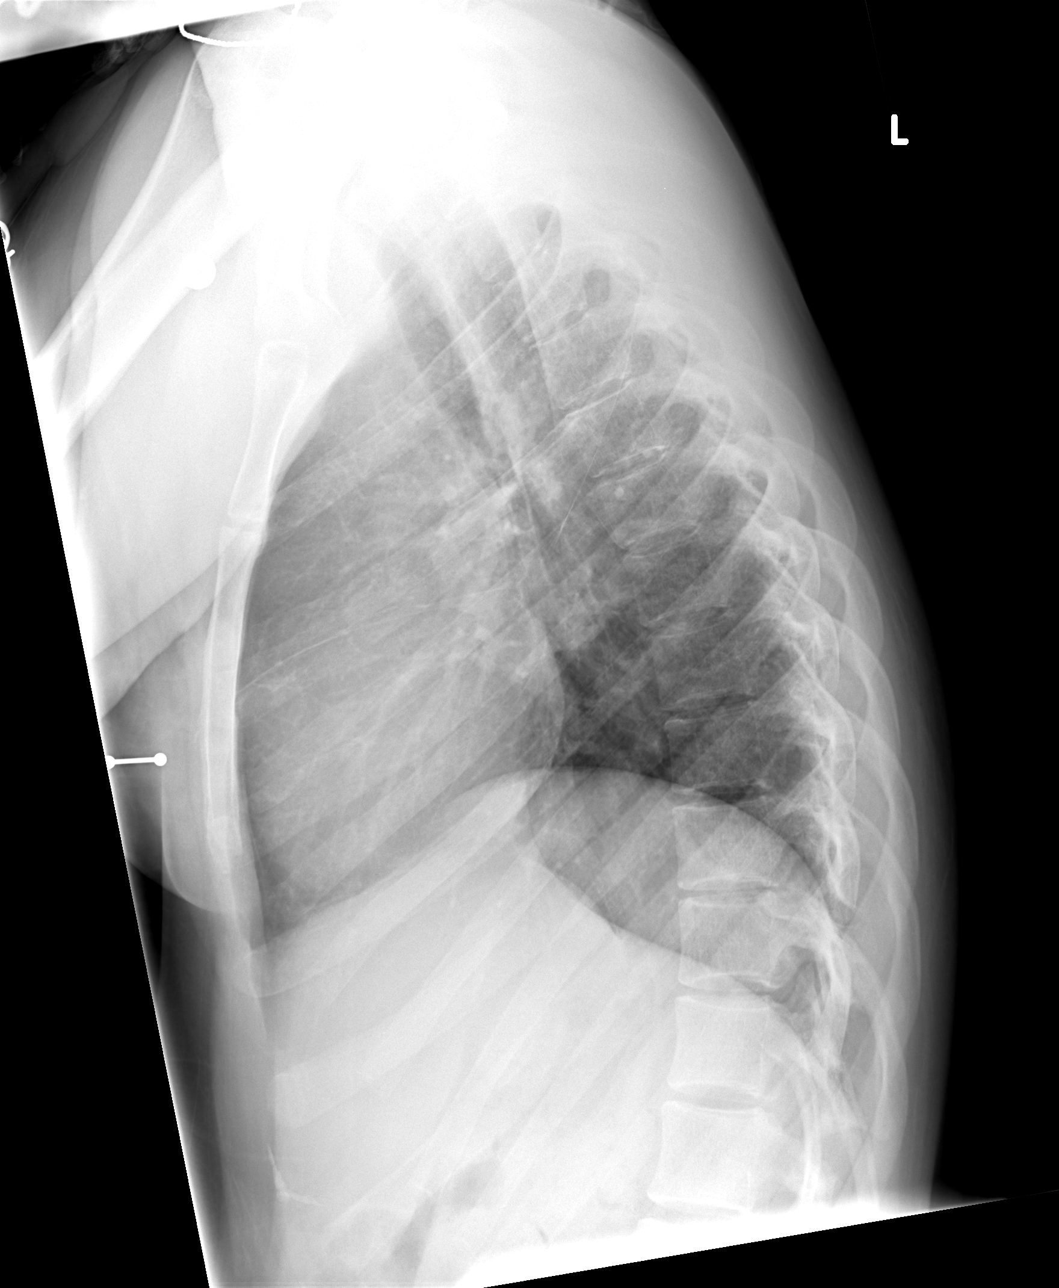

[view not recorded (3 of 4)]
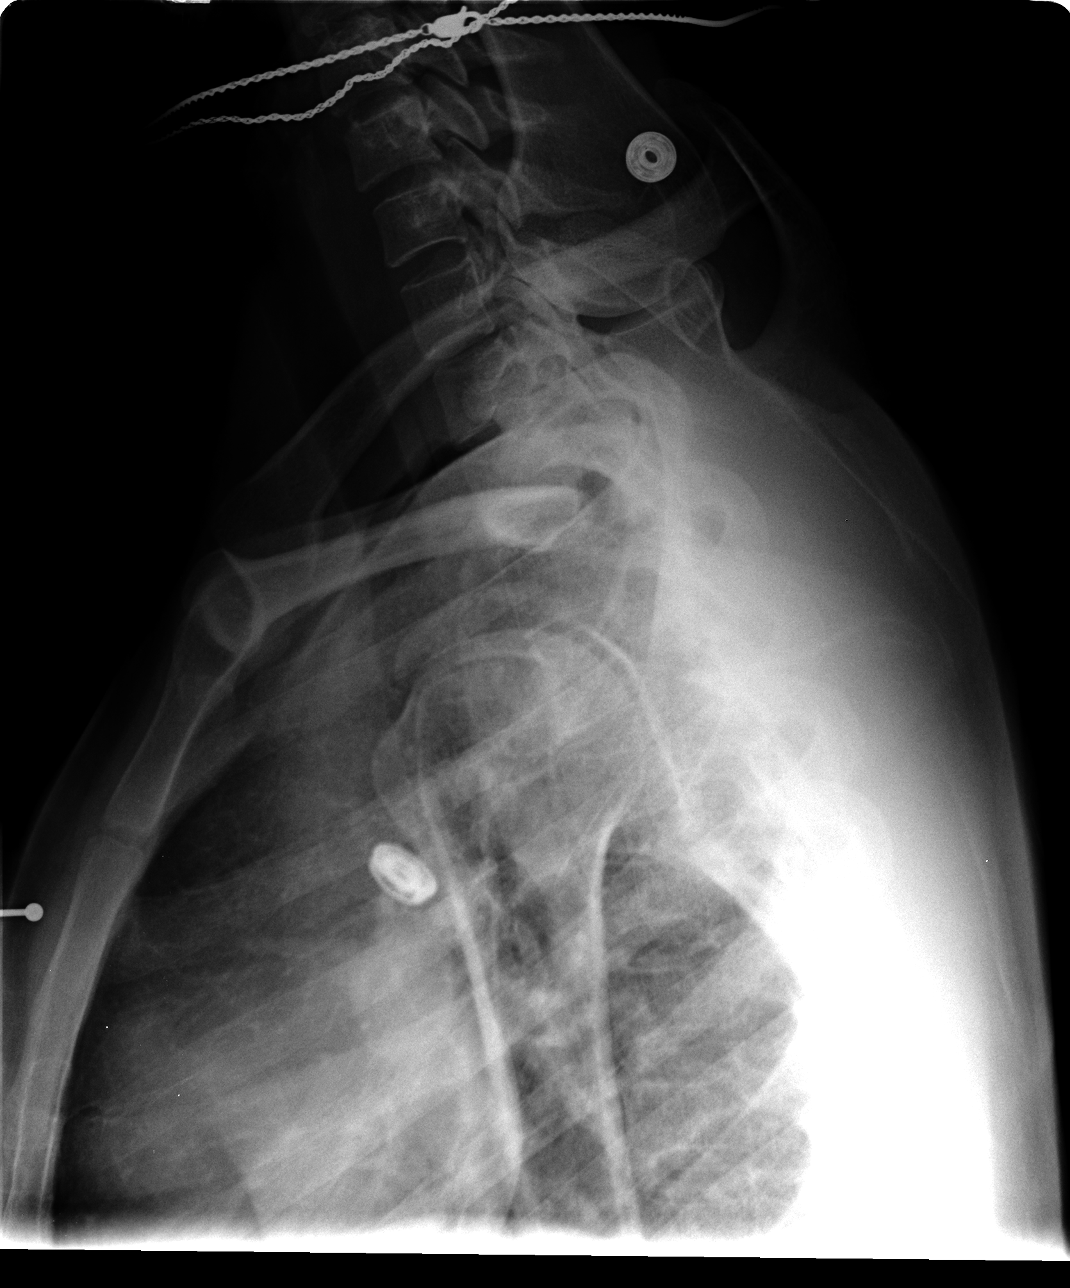

[view not recorded (4 of 4)]
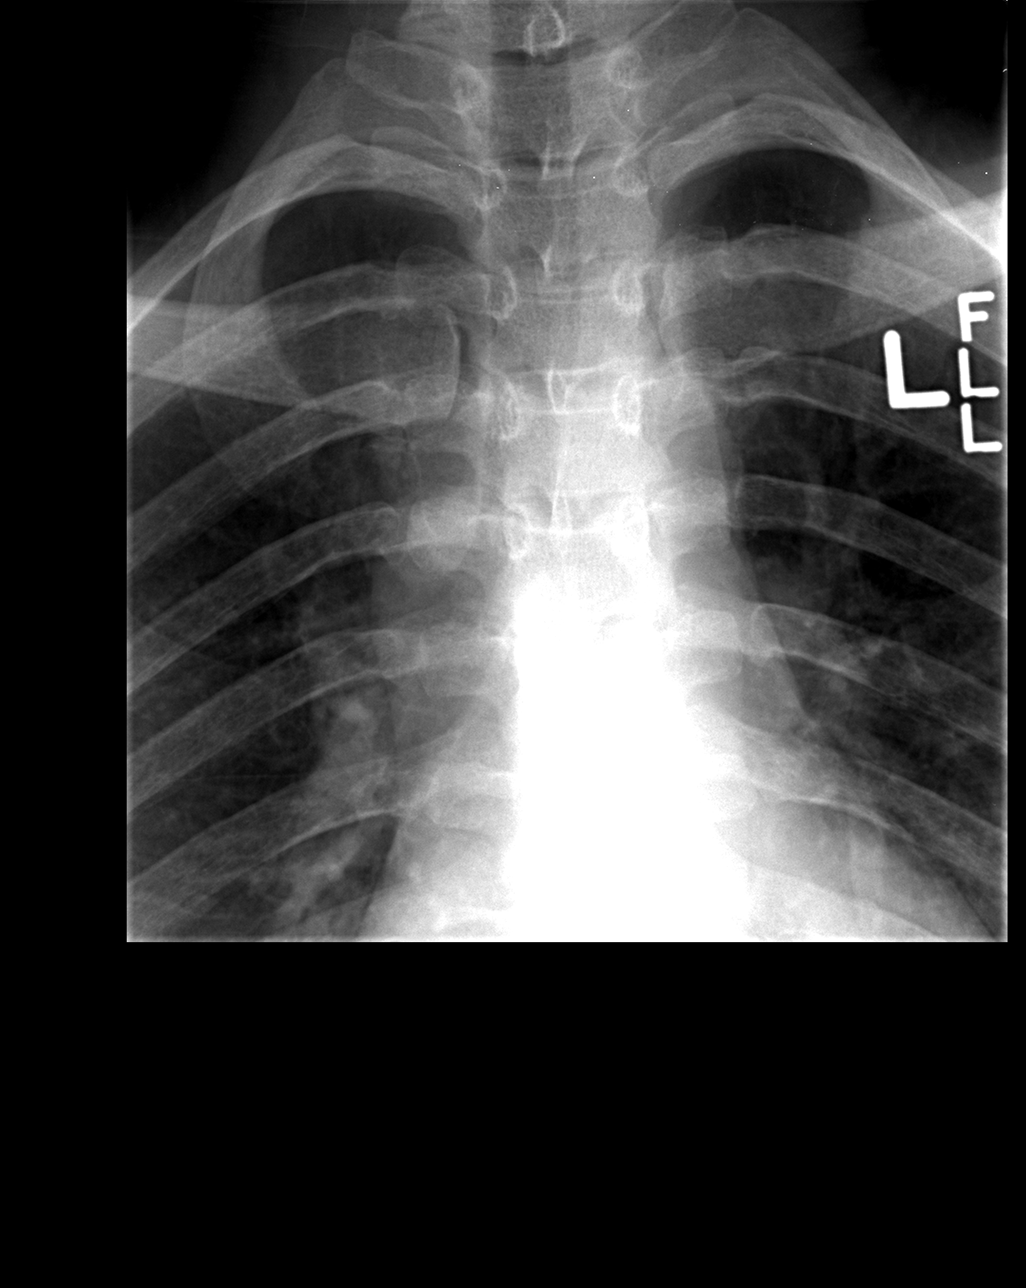

[4 of 4 positions shown; findings below may reference images not displayed]

FINDINGS: No acute bony abnormality.  Specifically, no fracture or
malalignment.  No significant degenerative disease.
IMPRESSION: Normal study.

## 2013-08-19 ENCOUNTER — Encounter: Payer: Medicaid Other | Admitting: Women's Health

## 2013-08-20 ENCOUNTER — Encounter: Payer: Medicaid Other | Admitting: Obstetrics & Gynecology

## 2013-08-21 ENCOUNTER — Encounter: Payer: Self-pay | Admitting: Obstetrics & Gynecology

## 2013-08-21 ENCOUNTER — Ambulatory Visit (INDEPENDENT_AMBULATORY_CARE_PROVIDER_SITE_OTHER): Payer: Medicaid Other | Admitting: Obstetrics & Gynecology

## 2013-08-21 VITALS — BP 110/70 | Wt 141.0 lb

## 2013-08-21 DIAGNOSIS — O09299 Supervision of pregnancy with other poor reproductive or obstetric history, unspecified trimester: Secondary | ICD-10-CM

## 2013-08-21 DIAGNOSIS — O99340 Other mental disorders complicating pregnancy, unspecified trimester: Secondary | ICD-10-CM

## 2013-08-21 DIAGNOSIS — O99810 Abnormal glucose complicating pregnancy: Secondary | ICD-10-CM

## 2013-08-21 DIAGNOSIS — Z3483 Encounter for supervision of other normal pregnancy, third trimester: Secondary | ICD-10-CM

## 2013-08-21 NOTE — Progress Notes (Signed)
BP weight and urine results all reviewed and noted. Patient reports good fetal movement, denies any bleeding and no rupture of membranes symptoms or regular contractions. Patient is without complaints. All questions were answered. Pt reports good sugars, left book at home

## 2013-08-26 ENCOUNTER — Encounter (HOSPITAL_COMMUNITY): Payer: Self-pay | Admitting: General Practice

## 2013-08-26 ENCOUNTER — Inpatient Hospital Stay (HOSPITAL_COMMUNITY)
Admission: AD | Admit: 2013-08-26 | Discharge: 2013-08-27 | Disposition: A | Discharge status: Home / Self Care | Payer: Medicaid Other | Source: Ambulatory Visit | Attending: Obstetrics & Gynecology | Admitting: Obstetrics & Gynecology

## 2013-08-26 DIAGNOSIS — M549 Dorsalgia, unspecified: Secondary | ICD-10-CM | POA: Insufficient documentation

## 2013-08-26 DIAGNOSIS — O479 False labor, unspecified: Secondary | ICD-10-CM | POA: Insufficient documentation

## 2013-08-26 LAB — POCT FERN TEST: POCT Fern Test: NEGATIVE

## 2013-08-26 NOTE — MAU Note (Signed)
Pt leaking clear/yellowish fluid since 1300, contractions on and off all day.  Pt G6 P4 at 38.4wks.

## 2013-08-27 NOTE — MAU Provider Note (Signed)
History     CSN: [id number]  Arrival date and time: 08/26/13 2213   First Provider Initiated Contact with Patient 08/26/13 2351      Chief Complaint  Patient presents with  . Rupture of Membranes  . Contractions   HPI Pt is 26 yo [id number] at [redacted]w[redacted]d who presents with back pain and leaking of fluid. She states that she has been having low back pain all day that is worse with sitting up, standing, position change. It gets intermittently worse and has some tightening across her abdomen that occurs a couple times an hour. She states that she feels like she lost her mucus plug and had a gush of fluid around 1300 and again at 1800 without leaking of fluid in between. It was enough that she had to change her underwear but has not had to wear a sanitary pad. She denies vaginal bleeding and reports good fetal movement.  OB History   Grav Para Term Preterm Abortions TAB SAB Ect Mult Living   6 4 4  1 1    4       Past Medical History  Diagnosis Date  . Hx of gonorrhea   . Hx of chlamydia infection   . Hx of syphilis   . HPV (human papilloma virus) infection   . Abnormal Pap smear   . Anxiety   . Depression   . Headache(784.0)   . Hemorrhoids   . Constipation   . Pregnant 05/27/2013  . Diabetes mellitus without complication     Past Surgical History  Procedure Laterality Date  . Colposcopy  2009    Family History  Problem Relation Age of Onset  . Alcohol abuse Father   . Asthma Maternal Grandmother   . Diabetes Maternal Grandmother   . Hypertension Maternal Grandmother     History  Substance Use Topics  . Smoking status: Current Every Day Smoker -- 0.50 packs/day    Types: Cigarettes  . Smokeless tobacco: Never Used  . Alcohol Use: Yes     Comment: Occ; not now    Allergies:  Allergies  Allergen Reactions  . Dimetapp Cold-Allergy [Brompheniramine-Phenylephrine] Other (See Comments)    Eyes roll in back of head  . Hydrocodone   . Robitussin (Alcohol Free)  [Guaifenesin] Other (See Comments)    Eyes roll in back of head  . Tramadol Hives and Rash    Prescriptions prior to admission  Medication Sig Dispense Refill  . acetaminophen (TYLENOL) 325 MG tablet Take 650 mg by mouth every 6 (six) hours as needed.      Marland Kitchen escitalopram (LEXAPRO) 10 MG tablet Take 1 tablet (10 mg total) by mouth daily.  30 tablet  6  . ondansetron (ZOFRAN) 8 MG tablet Take by mouth as needed for nausea.      Marland Kitchen oxyCODONE-acetaminophen (PERCOCET) 5-325 MG per tablet Take 2 tablets by mouth every 4 (four) hours as needed for pain.  10 tablet  0  . Pediatric Multiple Vit-C-FA (FLINSTONES GUMMIES OMEGA-3 DHA) CHEW Chew by mouth. Takes 2 daily      . pramoxine-hydrocortisone (PROCTOCREAM-HC) 1-1 % rectal cream Place rectally 2 (two) times daily.  30 g  1  . senna (SENOKOT) 8.6 MG TABS tablet Take 1 tablet (8.6 mg total) by mouth daily.  120 each  0    Review of Systems  Constitutional: Negative for fever, chills and malaise/fatigue.  HENT: Negative for congestion, sore throat and tinnitus.   Eyes: Negative for blurred vision, double vision  and photophobia.  Respiratory: Negative for cough, shortness of breath and wheezing.   Cardiovascular: Negative for chest pain, palpitations and leg swelling.  Gastrointestinal: Negative for nausea, vomiting, diarrhea, constipation and blood in stool.  Genitourinary: Negative for dysuria, urgency, frequency, hematuria and flank pain.  Musculoskeletal: Positive for back pain. Negative for falls, joint pain and myalgias.  Skin: Negative for rash.  Neurological: Negative for dizziness, tingling, tremors, focal weakness and headaches.   Physical Exam   Blood pressure 133/77, pulse 102, temperature 98.6 F (37 C), temperature source Oral, resp. rate 18, height 5\' 6"  (1.676 m), weight 63.957 kg (141 lb), last menstrual period 11/24/2012.  Physical Exam  Nursing note and vitals reviewed. Constitutional: She is oriented to person, place, and  time. She appears well-developed and well-nourished. No distress.  HENT:  Head: Normocephalic and atraumatic.  Eyes: Conjunctivae are normal. Right eye exhibits no discharge. Left eye exhibits no discharge. No scleral icterus.  Neck: Normal range of motion. Neck supple.  Cardiovascular: Normal rate, regular rhythm, normal heart sounds and intact distal pulses.   No murmur heard. Respiratory: Effort normal and breath sounds normal. She has no wheezes. She has no rales.  GI: Soft. Bowel sounds are normal. There is no tenderness. There is no guarding.  Genitourinary: Vagina normal.  Sterile speculum exam: normal external vagina without lesions. Normal rugated vaginal walls. White mucus without pooling of fluid. No bleeding.  Musculoskeletal: Normal range of motion. She exhibits no edema and no tenderness.  Neurological: She is alert and oriented to person, place, and time.  Skin: Skin is warm and dry. No rash noted.  Psychiatric: She has a normal mood and affect. Her behavior is normal.  Dilation: 3 Effacement (%): 60 Cervical Position: Posterior Station: -3 Presentation: Vertex Exam by:: L. Munford RN Toco: irregular contractions FHR: baseline 135 bpm, mod variability, accels present, no decels  MAU Course  Procedures Results for orders placed during the hospital encounter of 08/26/13 (from the past 24 hour(s))  POCT FERN TEST     Status: None   Collection Time    08/26/13 11:15 PM      Result Value Range   POCT Fern Test Negative = intact amniotic membranes    POCT FERN TEST     Status: None   Collection Time    08/26/13 11:48 PM      Result Value Range   POCT Fern Test Negative = intact amniotic membranes       Assessment and Plan  Early labor without cervical change or regular contraction pattern No ROM Labor precautions given Discharge to home  Pior, Jearld Lesch 08/27/2013, 12:02 AM   I have seen and examined this patient and agree with above documentation in the  resident's note. Pt was r/o for rupture with neg fern x3 and neg pool.  Story not as concerning.  Low back pain but without regular contractions to TOCO. Reassurance and labor precautions discussed. FWB Cat I tracing.   Rulon Abide, M.D. [facility] Fellow 08/27/2013 12:50 AM

## 2013-08-29 ENCOUNTER — Inpatient Hospital Stay (HOSPITAL_COMMUNITY)
Admission: AD | Admit: 2013-08-29 | Discharge: 2013-08-31 | DRG: 775 | Disposition: A | Discharge status: Home / Self Care | Payer: Medicaid Other | Source: Ambulatory Visit | Attending: Obstetrics & Gynecology | Admitting: Obstetrics & Gynecology

## 2013-08-29 ENCOUNTER — Ambulatory Visit (INDEPENDENT_AMBULATORY_CARE_PROVIDER_SITE_OTHER): Payer: Medicaid Other | Admitting: Obstetrics & Gynecology

## 2013-08-29 ENCOUNTER — Telehealth (HOSPITAL_COMMUNITY): Payer: Self-pay | Admitting: *Deleted

## 2013-08-29 ENCOUNTER — Encounter (HOSPITAL_COMMUNITY): Payer: Self-pay | Admitting: *Deleted

## 2013-08-29 ENCOUNTER — Encounter: Payer: Self-pay | Admitting: Obstetrics & Gynecology

## 2013-08-29 VITALS — BP 100/70 | Wt 140.0 lb

## 2013-08-29 DIAGNOSIS — O99340 Other mental disorders complicating pregnancy, unspecified trimester: Secondary | ICD-10-CM

## 2013-08-29 DIAGNOSIS — O09299 Supervision of pregnancy with other poor reproductive or obstetric history, unspecified trimester: Secondary | ICD-10-CM

## 2013-08-29 DIAGNOSIS — O0993 Supervision of high risk pregnancy, unspecified, third trimester: Secondary | ICD-10-CM

## 2013-08-29 DIAGNOSIS — Z3483 Encounter for supervision of other normal pregnancy, third trimester: Secondary | ICD-10-CM

## 2013-08-29 DIAGNOSIS — Z1389 Encounter for screening for other disorder: Secondary | ICD-10-CM

## 2013-08-29 DIAGNOSIS — O24410 Gestational diabetes mellitus in pregnancy, diet controlled: Secondary | ICD-10-CM

## 2013-08-29 DIAGNOSIS — Z331 Pregnant state, incidental: Secondary | ICD-10-CM

## 2013-08-29 LAB — CBC
HCT: 36 % (ref 36.0–46.0)
MCH: 31.4 pg (ref 26.0–34.0)
MCHC: 35.6 g/dL (ref 30.0–36.0)
MCV: 88.5 fL (ref 78.0–100.0)
Platelets: 324 10*3/uL (ref 150–400)
RBC: 4.07 MIL/uL (ref 3.87–5.11)
WBC: 24.1 10*3/uL — ABNORMAL HIGH (ref 4.0–10.5)

## 2013-08-29 LAB — POCT URINALYSIS DIPSTICK
Blood, UA: NEGATIVE
Glucose, UA: NEGATIVE
Ketones, UA: NEGATIVE
Leukocytes, UA: NEGATIVE
Nitrite, UA: NEGATIVE
Protein, UA: NEGATIVE

## 2013-08-29 MED ORDER — ACETAMINOPHEN 325 MG PO TABS: 650.0000 mg | ORAL_TABLET | ORAL | Status: DC | PRN

## 2013-08-29 MED ORDER — OXYTOCIN 40 UNITS IN LACTATED RINGERS INFUSION - SIMPLE MED
62.5000 mL/h | INTRAVENOUS | Status: DC
  Administered 2013-08-30: 62.5 mL/h via INTRAVENOUS
  Filled 2013-08-29: qty 1000

## 2013-08-29 MED ORDER — OXYTOCIN BOLUS FROM INFUSION
500.0000 mL | INTRAVENOUS | Status: DC
  Administered 2013-08-30: 500 mL via INTRAVENOUS

## 2013-08-29 MED ORDER — CITRIC ACID-SODIUM CITRATE 334-500 MG/5ML PO SOLN: 30.0000 mL | ORAL | Status: DC | PRN

## 2013-08-29 MED ORDER — FENTANYL 2.5 MCG/ML BUPIVACAINE 1/10 % EPIDURAL INFUSION (WH - ANES)
14.0000 mL/h | INTRAMUSCULAR | Status: DC | PRN
  Administered 2013-08-30: 14 mL/h via EPIDURAL
  Filled 2013-08-29: qty 125

## 2013-08-29 MED ORDER — DIPHENHYDRAMINE HCL 50 MG/ML IJ SOLN: 12.5000 mg | INTRAMUSCULAR | Status: DC | PRN

## 2013-08-29 MED ORDER — ONDANSETRON HCL 4 MG/2ML IJ SOLN: 4.0000 mg | Freq: Four times a day (QID) | INTRAMUSCULAR | Status: DC | PRN

## 2013-08-29 MED ORDER — LIDOCAINE HCL (PF) 1 % IJ SOLN
30.0000 mL | INTRAMUSCULAR | Status: DC | PRN
  Filled 2013-08-29 (×2): qty 30

## 2013-08-29 MED ORDER — PHENYLEPHRINE 40 MCG/ML (10ML) SYRINGE FOR IV PUSH (FOR BLOOD PRESSURE SUPPORT)
80.0000 ug | PREFILLED_SYRINGE | INTRAVENOUS | Status: DC | PRN
  Filled 2013-08-29: qty 2
  Filled 2013-08-29: qty 10

## 2013-08-29 MED ORDER — IBUPROFEN 600 MG PO TABS
600.0000 mg | ORAL_TABLET | Freq: Four times a day (QID) | ORAL | Status: DC | PRN
  Administered 2013-08-30: 600 mg via ORAL
  Filled 2013-08-29: qty 1

## 2013-08-29 MED ORDER — LACTATED RINGERS IV SOLN
500.0000 mL | Freq: Once | INTRAVENOUS | Status: AC
  Administered 2013-08-30: 500 mL via INTRAVENOUS

## 2013-08-29 MED ORDER — EPHEDRINE 5 MG/ML INJ
10.0000 mg | INTRAVENOUS | Status: DC | PRN
  Filled 2013-08-29: qty 4
  Filled 2013-08-29: qty 2

## 2013-08-29 MED ORDER — EPHEDRINE 5 MG/ML INJ
10.0000 mg | INTRAVENOUS | Status: DC | PRN
  Filled 2013-08-29: qty 2

## 2013-08-29 MED ORDER — LACTATED RINGERS IV SOLN: 500.0000 mL | INTRAVENOUS | Status: DC | PRN

## 2013-08-29 MED ORDER — LACTATED RINGERS IV SOLN
INTRAVENOUS | Status: DC
  Administered 2013-08-29 – 2013-08-30 (×2): via INTRAVENOUS

## 2013-08-29 MED ORDER — PHENYLEPHRINE 40 MCG/ML (10ML) SYRINGE FOR IV PUSH (FOR BLOOD PRESSURE SUPPORT)
80.0000 ug | PREFILLED_SYRINGE | INTRAVENOUS | Status: DC | PRN
  Filled 2013-08-29: qty 2

## 2013-08-29 MED ORDER — FLEET ENEMA 7-19 GM/118ML RE ENEM: 1.0000 | ENEMA | Freq: Once | RECTAL | Status: DC

## 2013-08-29 MED ORDER — OXYCODONE-ACETAMINOPHEN 5-325 MG PO TABS
1.0000 | ORAL_TABLET | ORAL | Status: DC | PRN
  Filled 2013-08-29: qty 1

## 2013-08-29 NOTE — MAU Note (Signed)
Contractions started at 1900, have gotten more intense and closer together.  No bleeding or rupture of membranes.  History of rapid labor

## 2013-08-29 NOTE — Progress Notes (Signed)
BP weight and urine results all reviewed and noted. Patient reports good fetal movement, denies any bleeding and no rupture of membranes symptoms or regular contractions. Patient is without complaints. All questions were answered.  

## 2013-08-29 NOTE — Addendum Note (Signed)
Addended by: Colen Darling on: 08/29/2013 11:32 AM   Modules accepted: Orders

## 2013-08-29 NOTE — Telephone Encounter (Signed)
Preadmission screen  

## 2013-08-30 ENCOUNTER — Encounter (HOSPITAL_COMMUNITY): Payer: Medicaid Other | Admitting: Anesthesiology

## 2013-08-30 ENCOUNTER — Encounter (HOSPITAL_COMMUNITY): Payer: Self-pay | Admitting: *Deleted

## 2013-08-30 ENCOUNTER — Inpatient Hospital Stay (HOSPITAL_COMMUNITY): Payer: Medicaid Other | Admitting: Anesthesiology

## 2013-08-30 DIAGNOSIS — O4290 Premature rupture of membranes, unspecified as to length of time between rupture and onset of labor, unspecified weeks of gestation: Secondary | ICD-10-CM

## 2013-08-30 MED ORDER — ONDANSETRON HCL 4 MG/2ML IJ SOLN: 4.0000 mg | INTRAMUSCULAR | Status: DC | PRN

## 2013-08-30 MED ORDER — DIBUCAINE 1 % RE OINT
1.0000 "application " | TOPICAL_OINTMENT | RECTAL | Status: DC | PRN
  Administered 2013-08-30: 1 via RECTAL
  Filled 2013-08-30: qty 28

## 2013-08-30 MED ORDER — ZOLPIDEM TARTRATE 5 MG PO TABS: 5.0000 mg | ORAL_TABLET | Freq: Every evening | ORAL | Status: DC | PRN

## 2013-08-30 MED ORDER — BISACODYL 10 MG RE SUPP: 10.0000 mg | Freq: Every day | RECTAL | Status: DC | PRN

## 2013-08-30 MED ORDER — LANOLIN HYDROUS EX OINT: TOPICAL_OINTMENT | CUTANEOUS | Status: DC | PRN

## 2013-08-30 MED ORDER — SIMETHICONE 80 MG PO CHEW: 80.0000 mg | CHEWABLE_TABLET | ORAL | Status: DC | PRN

## 2013-08-30 MED ORDER — IBUPROFEN 600 MG PO TABS
600.0000 mg | ORAL_TABLET | Freq: Four times a day (QID) | ORAL | Status: DC
  Administered 2013-08-30 – 2013-08-31 (×4): 600 mg via ORAL
  Filled 2013-08-30 (×4): qty 1

## 2013-08-30 MED ORDER — BENZOCAINE-MENTHOL 20-0.5 % EX AERO: 1.0000 "application " | INHALATION_SPRAY | CUTANEOUS | Status: DC | PRN

## 2013-08-30 MED ORDER — FERROUS SULFATE 325 (65 FE) MG PO TABS
325.0000 mg | ORAL_TABLET | Freq: Two times a day (BID) | ORAL | Status: DC
  Administered 2013-08-30 – 2013-08-31 (×3): 325 mg via ORAL
  Filled 2013-08-30 (×3): qty 1

## 2013-08-30 MED ORDER — MEASLES, MUMPS & RUBELLA VAC ~~LOC~~ INJ
0.5000 mL | INJECTION | Freq: Once | SUBCUTANEOUS | Status: DC
  Filled 2013-08-30: qty 0.5

## 2013-08-30 MED ORDER — LIDOCAINE HCL (PF) 1 % IJ SOLN
INTRAMUSCULAR | Status: DC | PRN
  Administered 2013-08-30 (×4): 4 mL

## 2013-08-30 MED ORDER — OXYTOCIN 40 UNITS IN LACTATED RINGERS INFUSION - SIMPLE MED: 62.5000 mL/h | INTRAVENOUS | Status: DC | PRN

## 2013-08-30 MED ORDER — FLEET ENEMA 7-19 GM/118ML RE ENEM: 1.0000 | ENEMA | Freq: Every day | RECTAL | Status: DC | PRN

## 2013-08-30 MED ORDER — DIPHENHYDRAMINE HCL 25 MG PO CAPS: 25.0000 mg | ORAL_CAPSULE | Freq: Four times a day (QID) | ORAL | Status: DC | PRN

## 2013-08-30 MED ORDER — TETANUS-DIPHTH-ACELL PERTUSSIS 5-2.5-18.5 LF-MCG/0.5 IM SUSP: 0.5000 mL | Freq: Once | INTRAMUSCULAR | Status: DC

## 2013-08-30 MED ORDER — SENNOSIDES-DOCUSATE SODIUM 8.6-50 MG PO TABS
2.0000 | ORAL_TABLET | ORAL | Status: DC
  Administered 2013-08-31: 2 via ORAL
  Filled 2013-08-30: qty 2

## 2013-08-30 MED ORDER — PNEUMOCOCCAL VAC POLYVALENT 25 MCG/0.5ML IJ INJ
0.5000 mL | INJECTION | INTRAMUSCULAR | Status: DC
  Filled 2013-08-30: qty 0.5

## 2013-08-30 MED ORDER — OXYCODONE-ACETAMINOPHEN 5-325 MG PO TABS
1.0000 | ORAL_TABLET | ORAL | Status: DC | PRN
  Administered 2013-08-30 (×2): 2 via ORAL
  Administered 2013-08-30: 1 via ORAL
  Administered 2013-08-30: 2 via ORAL
  Administered 2013-08-30: 1 via ORAL
  Administered 2013-08-31 (×3): 2 via ORAL
  Filled 2013-08-30: qty 1
  Filled 2013-08-30 (×6): qty 2

## 2013-08-30 MED ORDER — ONDANSETRON HCL 4 MG PO TABS: 4.0000 mg | ORAL_TABLET | ORAL | Status: DC | PRN

## 2013-08-30 MED ORDER — PRENATAL MULTIVITAMIN CH
1.0000 | ORAL_TABLET | Freq: Every day | ORAL | Status: DC
  Administered 2013-08-30: 1 via ORAL
  Filled 2013-08-30: qty 1

## 2013-08-30 MED ORDER — WITCH HAZEL-GLYCERIN EX PADS
1.0000 "application " | MEDICATED_PAD | CUTANEOUS | Status: DC | PRN
  Administered 2013-08-30: 1 via TOPICAL

## 2013-08-30 NOTE — Progress Notes (Signed)
UR chart review completed.  

## 2013-08-30 NOTE — H&P (Signed)
Lynn Spencer is a 27 y.o. female [phone] with IUP at 9w1d2 presenting for contractions. Pt states she has been having regular, every 5 minutes contractions, associated with none vaginal bleeding.  Membranes are intact, with active fetal movement.   [facility] 25 wks, where she was a transfer from [facility]  Prenatal History/Complications:  A1DM  Past Medical History: Past Medical History  Diagnosis Date  . Hx of gonorrhea   . Hx of chlamydia infection   . Hx of syphilis   . HPV (human papilloma virus) infection   . Abnormal Pap smear   . Anxiety   . Depression   . Headache(784.0)   . Hemorrhoids   . Constipation   . Pregnant 05/27/2013  . Diabetes mellitus without complication     Past Surgical History: Past Surgical History  Procedure Laterality Date  . Colposcopy  2009  . No past surgeries      Obstetrical History: OB History   Grav Para Term Preterm Abortions TAB SAB Ect Mult Living   6 4 4  1 1    4       Social History: History   Social History  . Marital Status: Single    Spouse Name: N/A    Number of Children: N/A  . Years of Education: N/A   Social History Main Topics  . Smoking status: Current Every Day Smoker -- 0.50 packs/day for 10 years    Types: Cigarettes  . Smokeless tobacco: Never Used  . Alcohol Use: Yes     Comment: Occ; not now  . Drug Use: No  . Sexual Activity: Yes    Birth Control/ Protection: None   Other Topics Concern  . None   Social History Narrative  . None    Family History: Family History  Problem Relation Age of Onset  . Alcohol abuse Father   . Asthma Maternal Grandmother   . Diabetes Maternal Grandmother   . Hypertension Maternal Grandmother     Allergies: Allergies  Allergen Reactions  . Dimetapp Cold-Allergy [Brompheniramine-Phenylephrine] Other (See Comments)    Eyes roll in back of head  . Hydrocodone   . Robitussin (Alcohol Free) [Guaifenesin] Other (See Comments)    Eyes roll in back  of head  . Tramadol Hives and Rash    Prescriptions prior to admission  Medication Sig Dispense Refill  . acetaminophen (TYLENOL) 325 MG tablet Take 650 mg by mouth every 6 (six) hours as needed.      . Pediatric Multiple Vit-C-FA (FLINSTONES GUMMIES OMEGA-3 DHA) CHEW Chew by mouth. Takes 2 daily      . escitalopram (LEXAPRO) 10 MG tablet Take 1 tablet (10 mg total) by mouth daily.  30 tablet  6  . ondansetron (ZOFRAN) 8 MG tablet Take by mouth as needed for nausea.      Marland Kitchen oxyCODONE-acetaminophen (PERCOCET) 5-325 MG per tablet Take 2 tablets by mouth every 4 (four) hours as needed for pain.  10 tablet  0  . pramoxine-hydrocortisone (PROCTOCREAM-HC) 1-1 % rectal cream Place rectally 2 (two) times daily.  30 g  1  . senna (SENOKOT) 8.6 MG TABS tablet Take 1 tablet (8.6 mg total) by mouth daily.  120 each  0     Review of Systems   Constitutional: Negative for fever and chills Eyes: Negative for visual disturbances Respiratory: Negative for shortness of breath, dyspnea Cardiovascular: Negative for chest pain or palpitations  Gastrointestinal: Negative for vomiting, diarrhea and constipation Genitourinary: Negative for  dysuria and urgency Musculoskeletal: Negative for back pain, joint pain, myalgias  Neurological: Negative for dizziness and headaches     Blood pressure 122/73, pulse 90, temperature 98.7 F (37.1 C), temperature source Oral, resp. rate 18, height 5\' 5"  (1.651 m), weight 63.504 kg (140 lb), last menstrual period 11/24/2012. General appearance: alert, cooperative and no distress Lungs: clear to auscultation bilaterally Heart: regular rate and rhythm Abdomen: soft, non-tender; bowel sounds normal Extremities: Homans sign is negative, no sign of DVT DTR's 2+ Presentation: cephalic Fetal monitoringBaseline: 150 bpm, Variability: Good {> 6 bpm), Accelerations: Reactive and Decelerations: Absent Uterine activity mod q 2-4  Dilation: 5 Effacement (%): 100 Exam by:: Drenda Freeze  CNM   Prenatal labs: ABO, Rh: A/Positive/-- (05/12 0000) Antibody: NEG (10/13 0913) Rubella:   RPR: NON REAC (10/13 0913)  HBsAg: Negative (05/12 0000)  HIV: NON REACTIVE (10/13 0913)  GBS: NEGATIVE (12/01 1614)  2 hr Glucola 83/182/147 Genetic screening  normal Anatomy US normal   Results for orders placed during the hospital encounter of 08/29/13 (from the past 24 hour(s))  CBC   Collection Time    08/29/13 11:20 PM      Result Value Range   WBC 24.1 (*) 4.0 - 10.5 K/uL   RBC 4.07  3.87 - 5.11 MIL/uL   Hemoglobin 12.8  12.0 - 15.0 g/dL   HCT 16.1  09.6 - 04.5 %   MCV 88.5  78.0 - 100.0 fL   MCH 31.4  26.0 - 34.0 pg   MCHC 35.6  30.0 - 36.0 g/dL   RDW 40.9  81.1 - 91.4 %   Platelets 324  150 - 400 K/uL  Results for orders placed in visit on 08/29/13 (from the past 24 hour(s))  POCT URINALYSIS DIPSTICK   Collection Time    08/29/13 11:31 AM      Result Value Range   Color, UA       Clarity, UA       Glucose, UA neg     Bilirubin, UA       Ketones, UA neg     Spec Grav, UA       Blood, UA neg     pH, UA       Protein, UA neg     Urobilinogen, UA       Nitrite, UA neg     Leukocytes, UA Negative      Assessment: Lynn Spencer is a 27 y.o. [phone] with an IUP at [redacted]w[redacted]d presenting for early labor  Plan: Admit; epidural   CRESENZO-DISHMAN,Iran Kievit 08/30/2013, 12:05 AM

## 2013-08-30 NOTE — H&P (Signed)
Attestation of Attending Supervision of Advanced Practitioner (PA/CNM/NP): Evaluation and management procedures were performed by the Advanced Practitioner under my supervision and collaboration.  I have reviewed the Advanced Practitioner's note and chart, and I agree with the management and plan.  Orlando Devereux, MD, FACOG Attending Obstetrician & Gynecologist Faculty Practice, [facility]  

## 2013-08-30 NOTE — Anesthesia Postprocedure Evaluation (Signed)
Anesthesia Post Note  Patient: Lynn Spencer  Procedure(s) Performed: * No procedures listed *  Anesthesia type: Epidural  Patient location: Mother/Baby  Post pain: Pain level controlled  Post assessment: Post-op Vital signs reviewed  Last Vitals:  Filed Vitals:   08/30/13 0500  BP: 130/81  Pulse: 101  Temp:   Resp: 18    Post vital signs: Reviewed  Level of consciousness: awake  Complications: No apparent anesthesia complications

## 2013-08-30 NOTE — Anesthesia Preprocedure Evaluation (Signed)
Anesthesia Evaluation  Patient identified by MRN, date of birth, ID band Patient awake    Reviewed: Allergy & Precautions, H&P , NPO status , Patient's Chart, lab work & pertinent test results, reviewed documented beta blocker date and time   History of Anesthesia Complications Negative for: history of anesthetic complications  Airway Mallampati: I TM Distance: >3 FB Neck ROM: full    Dental  (+) Teeth Intact   Pulmonary Current Smoker,  breath sounds clear to auscultation        Cardiovascular negative cardio ROS  Rhythm:regular Rate:Normal     Neuro/Psych  Headaches, PSYCHIATRIC DISORDERS (depression/anxiety)    GI/Hepatic negative GI ROS, Neg liver ROS,   Endo/Other  negative endocrine ROS  Renal/GU negative Renal ROS  negative genitourinary   Musculoskeletal   Abdominal   Peds  Hematology negative hematology ROS (+)   Anesthesia Other Findings   Reproductive/Obstetrics (+) Pregnancy                           Anesthesia Physical Anesthesia Plan  ASA: II  Anesthesia Plan: Epidural   Post-op Pain Management:    Induction:   Airway Management Planned:   Additional Equipment:   Intra-op Plan:   Post-operative Plan:   Informed Consent: I have reviewed the patients History and Physical, chart, labs and discussed the procedure including the risks, benefits and alternatives for the proposed anesthesia with the patient or authorized representative who has indicated his/her understanding and acceptance.     Plan Discussed with:   Anesthesia Plan Comments:         Anesthesia Quick Evaluation

## 2013-08-30 NOTE — Anesthesia Procedure Notes (Signed)
Epidural Patient location during procedure: OB Start time: 08/30/2013 12:06 AM  Staffing Performed by: anesthesiologist   Preanesthetic Checklist Completed: patient identified, site marked, surgical consent, pre-op evaluation, timeout performed, IV checked, risks and benefits discussed and monitors and equipment checked  Epidural Patient position: sitting Prep: site prepped and draped and DuraPrep Patient monitoring: continuous pulse ox and blood pressure Approach: midline Injection technique: LOR air  Needle:  Needle type: Tuohy  Needle gauge: 17 G Needle length: 9 cm and 9 Needle insertion depth: 5 cm cm Catheter type: closed end flexible Catheter size: 19 Gauge Catheter at skin depth: 10 cm Test dose: negative  Assessment Events: blood not aspirated, injection not painful, no injection resistance, negative IV test and no paresthesia  Additional Notes Discussed risk of headache, infection, bleeding, nerve injury and failed or incomplete block.  Patient voices understanding and wishes to proceed.  Epidural placed easily on first attempt.  No paresthesia.  Patient tolerated procedure well with no apparent complications.  Jasmine December, MDReason for block:procedure for pain

## 2013-08-31 MED ORDER — IBUPROFEN 600 MG PO TABS: 600.0000 mg | ORAL_TABLET | Freq: Four times a day (QID) | ORAL | Status: DC

## 2013-08-31 NOTE — Progress Notes (Signed)
Clinical Social Work Department PSYCHOSOCIAL ASSESSMENT - MATERNAL/CHILD 08/31/2013  Patient:  Lynn Spencer, Lynn Spencer  Account Number:  [id number]  Admit Date:  08/29/2013  Marjo Bicker Name:   Wynetta Fines    Clinical Social Worker:  Leasha Goldberger, LCSW   Date/Time:  08/31/2013 01:00 PM  Date Referred:  08/29/2013   Referral source  Central Nursery     Referred reason  Depression/Anxiety   Other referral source:    I:  FAMILY / HOME ENVIRONMENT Child's legal guardian:  PARENT  Guardian - Name Guardian - Age Guardian - Address  SHAMA, MONFILS [address]  [place], [place] [address]  Majel Homer     Other household support members/support persons Other support:   Maternal grandmother is caring for the two  other children while mother is hospitalized.  She also reports that grandmother is financially supportive    II  PSYCHOSOCIAL DATA Information Source:  Patient Interview  Event organiser Employment:   Mother states that she was employeed during the benining of the pregnancy and plans to return to work.   Financial resources:  Medicaid If Medicaid - [employer]:   Clinical biochemist  [employer]   School / Grade:   Maternity Care Coordinator / Child Services Coordination / Early Interventions:  Cultural issues impacting care:    III  STRENGTHS Strengths  Supportive family/friends  Home prepared for Child (including basic supplies)  Adequate Resources   Strength comment:    IV  RISK FACTORS AND CURRENT PROBLEMS Current Problem:     Risk Factor & Current Problem Patient Issue Family Issue Risk Factor / Current Problem Comment  Mental Illness Y N hx of depression  Substance Abuse Y N Hx of marijuana use    V  SOCIAL WORK ASSESSMENT Acknowledged order for Social Work consult to assess pt's history of Depression and substance abuse.   Mother was receptive to social work intervention.  FOB was present but did not participate in the interview.  She is a single parent  with two other dependents ages 28 and 2.  She also has two older children that were adopted at 67 months old and 28 years old.  Informed that she was 27 when she had the first child and 27 when she had the second.  Informed that she was unable to adequately provide for the children and DSS became involved and the children were adopted by non-relative.  Mother reports hx of mental illness. Informed that she was diagnosed with depression at age 27 and prescribed medication for the depression.  Patient states that she took medication for the depression on and off for many years.  She reports hx of PP Depression with her 28 year old and was prescribed Prozac which she took for about a month and the symptoms resolved.  Informed that she is not currently on any medication for depression.  Mother admits to use of marijuana prior to becoming aware of the pregnancy.  She denies any other illicit drug use during pregnancy.  She also states that she was being prescribed Percocet for back pain in the past, but denies any abuse of this medication, and states that it has been a while since she took Percocet's.  Mother denies intent to continue using marijuana.  Encouraged treatment if needed. UDS on newborn was negative.   She denies any hx of psychiatric hospitalization or current symptoms of depression.  She reports all treatment history an outpatient setting.  Provided her with literature and  treatment resources if needed for PP Depression.  Informed of social work Surveyor, mining.      VI SOCIAL WORK PLAN  Type of pt/family education:   If child protective services report - county:   If child protective services report - date:   Information/referral to community resources comment:   Other social work plan:   CSW will follow PRN. Gurnie Duris J, LCSW

## 2013-08-31 NOTE — Discharge Summary (Signed)
`````  Attestation of Attending Supervision of Advanced Practitioner: Evaluation and management procedures were performed by the PA/NP/CNM/OB Fellow under my supervision/collaboration. Chart reviewed and agree with management and plan.  Warren Lindahl V 08/31/2013 9:16 AM

## 2013-08-31 NOTE — Discharge Summary (Signed)
Obstetric Discharge Summary Reason for Admission: onset of labor Prenatal Procedures: NST and ultrasound Intrapartum Procedures: spontaneous vaginal delivery Postpartum Procedures: none Complications-Operative and Postpartum: none Hemoglobin  Date Value Range Status  08/29/2013 12.8  12.0 - 15.0 g/dL Final  1/61/0960 45.4   Final     HCT  Date Value Range Status  08/29/2013 36.0  36.0 - 46.0 % Final  01/21/2013 38   Final  Brief Hospital Course:  Lynn Spencer is a 27 y.o. G6 now P5015 who presented in active labor and had a baby boy by NSVD at [redacted]w[redacted]d. She has had an uneventful hospital course and is requesting discharge on PPD#1. She will follow up at [facility] in 6 weeks, desires depo for contraception, and desires an outpatient circumcision.   Physical Exam:  General: alert, cooperative and appears stated age Lochia: appropriate Uterine Fundus: firm Incision: n/a DVT Evaluation: No evidence of DVT seen on physical exam. No cords or calf tenderness. No significant calf/ankle edema.  Discharge Diagnoses: Term Pregnancy-delivered  Discharge Information: Date: 08/31/2013 Activity: pelvic rest Diet: routine Medications: PNV and Ibuprofen Condition: stable Instructions: refer to practice specific booklet Discharge to: home Follow-up Information   Follow up with [facility] In 6 weeks.   Specialty:  Obstetrics and Gynecology   Contact information:   [address] [place] [address] [phone]      Newborn Data: Live born female  Birth Weight: 6 lb 8.9 oz (2975 g) APGAR: 9, 9  Home with mother.  Lynn Spencer 08/31/2013, 7:39 AM  I have seen and examined this patient and I agree with the above. Cam Hai 8:33 AM 08/31/2013

## 2013-09-02 ENCOUNTER — Telehealth: Payer: Self-pay | Admitting: Women's Health

## 2013-09-02 MED ORDER — CYCLOBENZAPRINE HCL 10 MG PO TABS: 10.0000 mg | ORAL_TABLET | Freq: Three times a day (TID) | ORAL | Status: DC | PRN

## 2013-09-02 NOTE — Telephone Encounter (Signed)
Had uncomplicated SVD with epidural. Rx flexeril 10mg  TID prn. Already has rx for motrin

## 2013-09-02 NOTE — Telephone Encounter (Signed)
Pt states had vaginal delivery on 08/30/2013, c/o back pain. Pt states Motrin not helping. Requesting RX for pain.

## 2013-09-02 NOTE — Telephone Encounter (Signed)
Pt informed Flexeril sent to pharmacy and try heat/ice prn per Cathie Beams, CNM. Pt verbalized understanding.

## 2013-09-07 ENCOUNTER — Inpatient Hospital Stay (HOSPITAL_COMMUNITY): Admission: RE | Admit: 2013-09-07 | Payer: Medicaid Other | Source: Ambulatory Visit

## 2013-10-01 ENCOUNTER — Encounter: Payer: Self-pay | Admitting: *Deleted

## 2013-10-01 NOTE — Progress Notes (Unsigned)
Patient ID: Lynn Spencer, female   DOB: 11/27/1985, 28 y.o.   MRN: [id number]   Called pt to schedule pp visit but had to leave message on machine.  10/01/13  AS

## 2013-10-09 ENCOUNTER — Telehealth: Payer: Self-pay | Admitting: Advanced Practice Midwife

## 2013-10-09 ENCOUNTER — Ambulatory Visit (INDEPENDENT_AMBULATORY_CARE_PROVIDER_SITE_OTHER): Payer: Medicaid Other | Admitting: Advanced Practice Midwife

## 2013-10-09 ENCOUNTER — Encounter: Payer: Self-pay | Admitting: Advanced Practice Midwife

## 2013-10-09 DIAGNOSIS — Z3202 Encounter for pregnancy test, result negative: Secondary | ICD-10-CM

## 2013-10-09 LAB — POCT URINE PREGNANCY: Preg Test, Ur: NEGATIVE

## 2013-10-09 MED ORDER — MEDROXYPROGESTERONE ACETATE 150 MG/ML IM SUSP: 150.0000 mg | INTRAMUSCULAR | Status: DC

## 2013-10-09 NOTE — Progress Notes (Signed)
  Lynn Spencer is a 28 y.o. who presents for a postpartum visit. She is 5 weeks postpartum following a spontaneous vaginal delivery. I have fully reviewed the prenatal and intrapartum course. The delivery was at 39.1  gestational weeks.  Anesthesia: epidural. Postpartum course has been unevenful. Baby's course has been uneventful. Baby is feeding by bottle. Bleeding: no bleeding. Bowel function is normal. Bladder function is normal. Patient is sexually active. Contraception method is none.  Last intercourse was a few nights ago.  Will start depo in about 2 weeks when a UPT can be done. Postpartum depression screening: negative.    Review of Systems   Constitutional: Negative for fever and chills Eyes: Negative for visual disturbances Respiratory: Negative for shortness of breath, dyspnea Cardiovascular: Negative for chest pain or palpitations  Gastrointestinal: Negative for vomiting, diarrhea and constipation Genitourinary: Negative for dysuria and urgency Musculoskeletal: Negative for joint pain, myalgias ; POSTIVE for low back pain (chronic) Neurological: Negative for dizziness and headaches   Objective:     Filed Vitals:   10/09/13 1326  BP: 100/68   General:  alert, cooperative and no distress   Breasts:  negative  Lungs: clear to auscultation bilaterally  Heart:  regular rate and rhythm  Abdomen: Soft, nontender   Vulva:  normal  Vagina: normal vagina  Cervix:  closed  Corpus: Well involuted     Rectal Exam: no hemorrhoids        Assessment:    normal postpartum exam.   Plan:    1. Contraception: Depo-Provera injections 2. Follow up in: 2 weeks for depo or sooner if starts period 3.  Referral [facility] [email or web address] n[facility] request for low back pain

## 2013-10-23 ENCOUNTER — Ambulatory Visit (INDEPENDENT_AMBULATORY_CARE_PROVIDER_SITE_OTHER): Payer: Medicaid Other | Admitting: Adult Health

## 2013-10-23 ENCOUNTER — Encounter: Payer: Self-pay | Admitting: Adult Health

## 2013-10-23 ENCOUNTER — Encounter: Payer: Self-pay | Admitting: *Deleted

## 2013-10-23 VITALS — BP 108/28 | Wt 126.0 lb

## 2013-10-23 DIAGNOSIS — Z309 Encounter for contraceptive management, unspecified: Secondary | ICD-10-CM

## 2013-10-23 DIAGNOSIS — Z3049 Encounter for surveillance of other contraceptives: Secondary | ICD-10-CM

## 2013-10-23 DIAGNOSIS — Z3202 Encounter for pregnancy test, result negative: Secondary | ICD-10-CM

## 2013-10-23 LAB — POCT URINE PREGNANCY: Preg Test, Ur: NEGATIVE

## 2013-10-23 MED ORDER — MEDROXYPROGESTERONE ACETATE 150 MG/ML IM SUSP
150.0000 mg | Freq: Once | INTRAMUSCULAR | Status: AC
  Administered 2013-10-23: 150 mg via INTRAMUSCULAR

## 2013-12-20 NOTE — Telephone Encounter (Signed)
Close encounter 

## 2014-01-16 ENCOUNTER — Ambulatory Visit: Payer: Medicaid Other

## 2014-01-17 ENCOUNTER — Ambulatory Visit: Payer: Medicaid Other

## 2014-06-27 ENCOUNTER — Other Ambulatory Visit: Payer: Self-pay

## 2014-07-12 ENCOUNTER — Encounter (HOSPITAL_COMMUNITY): Payer: Self-pay | Admitting: Emergency Medicine

## 2014-07-12 ENCOUNTER — Emergency Department (HOSPITAL_COMMUNITY)
Admission: EM | Admit: 2014-07-12 | Discharge: 2014-07-12 | Disposition: A | Discharge status: Home / Self Care | Payer: Medicaid Other | Attending: Emergency Medicine | Admitting: Emergency Medicine

## 2014-07-12 DIAGNOSIS — Z791 Long term (current) use of non-steroidal anti-inflammatories (NSAID): Secondary | ICD-10-CM | POA: Insufficient documentation

## 2014-07-12 DIAGNOSIS — Z8619 Personal history of other infectious and parasitic diseases: Secondary | ICD-10-CM | POA: Diagnosis not present

## 2014-07-12 DIAGNOSIS — Z720 Tobacco use: Secondary | ICD-10-CM | POA: Insufficient documentation

## 2014-07-12 DIAGNOSIS — Z3202 Encounter for pregnancy test, result negative: Secondary | ICD-10-CM | POA: Diagnosis not present

## 2014-07-12 DIAGNOSIS — Z8719 Personal history of other diseases of the digestive system: Secondary | ICD-10-CM | POA: Insufficient documentation

## 2014-07-12 DIAGNOSIS — E119 Type 2 diabetes mellitus without complications: Secondary | ICD-10-CM | POA: Insufficient documentation

## 2014-07-12 DIAGNOSIS — M549 Dorsalgia, unspecified: Secondary | ICD-10-CM | POA: Insufficient documentation

## 2014-07-12 DIAGNOSIS — F419 Anxiety disorder, unspecified: Secondary | ICD-10-CM | POA: Insufficient documentation

## 2014-07-12 DIAGNOSIS — N12 Tubulo-interstitial nephritis, not specified as acute or chronic: Secondary | ICD-10-CM

## 2014-07-12 DIAGNOSIS — N760 Acute vaginitis: Secondary | ICD-10-CM | POA: Diagnosis not present

## 2014-07-12 DIAGNOSIS — B9689 Other specified bacterial agents as the cause of diseases classified elsewhere: Secondary | ICD-10-CM

## 2014-07-12 DIAGNOSIS — G8929 Other chronic pain: Secondary | ICD-10-CM | POA: Diagnosis not present

## 2014-07-12 DIAGNOSIS — R102 Pelvic and perineal pain: Secondary | ICD-10-CM | POA: Diagnosis present

## 2014-07-12 DIAGNOSIS — N119 Chronic tubulo-interstitial nephritis, unspecified: Secondary | ICD-10-CM | POA: Diagnosis not present

## 2014-07-12 LAB — PREGNANCY, URINE: PREG TEST UR: NEGATIVE

## 2014-07-12 LAB — COMPREHENSIVE METABOLIC PANEL
ALT: 14 U/L (ref 0–35)
AST: 14 U/L (ref 0–37)
Albumin: 4.2 g/dL (ref 3.5–5.2)
Alkaline Phosphatase: 69 U/L (ref 39–117)
Anion gap: 17 — ABNORMAL HIGH (ref 5–15)
BUN: 9 mg/dL (ref 6–23)
CALCIUM: 9.5 mg/dL (ref 8.4–10.5)
CHLORIDE: 100 meq/L (ref 96–112)
CO2: 23 meq/L (ref 19–32)
Creatinine, Ser: 0.8 mg/dL (ref 0.50–1.10)
GLUCOSE: 122 mg/dL — AB (ref 70–99)
Potassium: 3.6 mEq/L — ABNORMAL LOW (ref 3.7–5.3)
SODIUM: 140 meq/L (ref 137–147)
Total Bilirubin: 0.5 mg/dL (ref 0.3–1.2)
Total Protein: 8 g/dL (ref 6.0–8.3)

## 2014-07-12 LAB — CBC WITH DIFFERENTIAL/PLATELET
Basophils Absolute: 0 10*3/uL (ref 0.0–0.1)
Basophils Relative: 0 % (ref 0–1)
EOS PCT: 0 % (ref 0–5)
Eosinophils Absolute: 0 10*3/uL (ref 0.0–0.7)
HEMATOCRIT: 40.7 % (ref 36.0–46.0)
Hemoglobin: 13.9 g/dL (ref 12.0–15.0)
LYMPHS ABS: 2.8 10*3/uL (ref 0.7–4.0)
LYMPHS PCT: 12 % (ref 12–46)
MCH: 30.5 pg (ref 26.0–34.0)
MCHC: 34.2 g/dL (ref 30.0–36.0)
MCV: 89.5 fL (ref 78.0–100.0)
MONO ABS: 0.9 10*3/uL (ref 0.1–1.0)
Monocytes Relative: 4 % (ref 3–12)
NEUTROS ABS: 19.6 10*3/uL — AB (ref 1.7–7.7)
Neutrophils Relative %: 84 % — ABNORMAL HIGH (ref 43–77)
Platelets: 336 10*3/uL (ref 150–400)
RBC: 4.55 MIL/uL (ref 3.87–5.11)
RDW: 13 % (ref 11.5–15.5)
WBC: 23.3 10*3/uL — AB (ref 4.0–10.5)

## 2014-07-12 LAB — URINALYSIS, ROUTINE W REFLEX MICROSCOPIC
Bilirubin Urine: NEGATIVE
GLUCOSE, UA: NEGATIVE mg/dL
Ketones, ur: NEGATIVE mg/dL
Nitrite: POSITIVE — AB
Protein, ur: 30 mg/dL — AB
SPECIFIC GRAVITY, URINE: 1.01 (ref 1.005–1.030)
Urobilinogen, UA: 0.2 mg/dL (ref 0.0–1.0)
pH: 6.5 (ref 5.0–8.0)

## 2014-07-12 LAB — WET PREP, GENITAL
Trich, Wet Prep: NONE SEEN
Yeast Wet Prep HPF POC: NONE SEEN

## 2014-07-12 LAB — RPR

## 2014-07-12 LAB — URINE MICROSCOPIC-ADD ON

## 2014-07-12 MED ORDER — ONDANSETRON HCL 4 MG/2ML IJ SOLN
4.0000 mg | Freq: Once | INTRAMUSCULAR | Status: AC
  Administered 2014-07-12: 4 mg via INTRAVENOUS
  Filled 2014-07-12: qty 2

## 2014-07-12 MED ORDER — PROMETHAZINE HCL 25 MG RE SUPP: 12.5000 mg | Freq: Four times a day (QID) | RECTAL | Status: DC | PRN

## 2014-07-12 MED ORDER — PROMETHAZINE HCL 25 MG/ML IJ SOLN
12.5000 mg | Freq: Once | INTRAMUSCULAR | Status: AC
  Administered 2014-07-12: 12.5 mg via INTRAVENOUS
  Filled 2014-07-12: qty 1

## 2014-07-12 MED ORDER — METRONIDAZOLE 0.75 % VA GEL: 1.0000 | Freq: Two times a day (BID) | VAGINAL | Status: DC

## 2014-07-12 MED ORDER — ACETAMINOPHEN 325 MG PO TABS
650.0000 mg | ORAL_TABLET | Freq: Once | ORAL | Status: AC
  Administered 2014-07-12: 650 mg via ORAL
  Filled 2014-07-12: qty 2

## 2014-07-12 MED ORDER — CEPHALEXIN 500 MG PO CAPS: 500.0000 mg | ORAL_CAPSULE | Freq: Four times a day (QID) | ORAL | Status: DC

## 2014-07-12 MED ORDER — HYDROMORPHONE HCL 1 MG/ML IJ SOLN
1.0000 mg | Freq: Once | INTRAMUSCULAR | Status: AC
  Administered 2014-07-12: 1 mg via INTRAVENOUS
  Filled 2014-07-12: qty 1

## 2014-07-12 MED ORDER — KETOROLAC TROMETHAMINE 30 MG/ML IJ SOLN
30.0000 mg | Freq: Once | INTRAMUSCULAR | Status: AC
  Administered 2014-07-12: 30 mg via INTRAVENOUS
  Filled 2014-07-12: qty 1

## 2014-07-12 MED ORDER — SODIUM CHLORIDE 0.9 % IV SOLN
INTRAVENOUS | Status: DC
  Administered 2014-07-12: 09:00:00 via INTRAVENOUS

## 2014-07-12 MED ORDER — DEXTROSE 5 % IV SOLN
1.0000 g | Freq: Once | INTRAVENOUS | Status: AC
  Administered 2014-07-12: 1 g via INTRAVENOUS
  Filled 2014-07-12: qty 10

## 2014-07-12 MED ORDER — ONDANSETRON 4 MG PO TBDP: 4.0000 mg | ORAL_TABLET | Freq: Three times a day (TID) | ORAL | Status: DC | PRN

## 2014-07-12 NOTE — Discharge Instructions (Signed)
We have sent your urine for culture. We will call you if we need to change your medication. Follow up with Dr. Emelda FearFerguson, return here as needed.   Pyelonephritis, Adult Pyelonephritis is a kidney infection. A kidney infection can happen quickly, or it can last for a long time. HOME CARE   Take your medicine (antibiotics) as told. Finish it even if you start to feel better.  Keep all doctor visits as told.  Drink enough fluids to keep your pee (urine) clear or pale yellow.  Only take medicine as told by your doctor. GET HELP RIGHT AWAY IF:   You have a fever or lasting symptoms for more than 2-3 days.  You have a fever and your symptoms suddenly get worse.  You cannot take your medicine or drink fluids as told.  You have chills and shaking.  You feel very weak or pass out (faint).  You do not feel better after 2 days. MAKE SURE YOU:  Understand these instructions.  Will watch your condition.  Will get help right away if you are not doing well or get worse. Document Released: 10/06/2004 Document Revised: 02/28/2012 Document Reviewed: 02/16/2011 [facility] Patient Information 2015 [place], [place]. This information is not intended to replace advice given to you by your health care provider. Make sure you discuss any questions you have with your health care provider.

## 2014-07-12 NOTE — ED Notes (Signed)
Patient c/o left pelvic pain that started three days ago. Burning and difficulty urinating. Patient states she took a Oxycodone 10 mg at home. States no relief.

## 2014-07-12 NOTE — ED Notes (Signed)
Water given for po trial

## 2014-07-12 NOTE — ED Notes (Signed)
Patient with no complaints at this time. Respirations even and unlabored. Skin warm/dry. Discharge instructions reviewed with patient at this time. Patient given opportunity to voice concerns/ask questions. IV removed per policy and band-aid applied to site. Patient discharged at this time and left Emergency Department with steady gait.  

## 2014-07-12 NOTE — ED Notes (Signed)
Patient resting in bed, eyes closed.

## 2014-07-12 NOTE — ED Provider Notes (Signed)
CSN: [id number]     Arrival date & time 07/12/14  0806 History  This chart was scribed for non-physician practitioner working with Hurman Horn, MD, by Roxy Cedar ED Scribe. This patient was seen in room APA10/APA10 and the patient's care was started at 8:51 AM  Chief Complaint  Patient presents with  . Pelvic Pain   Patient is a 28 y.o. female presenting with pelvic pain. The history is provided by the patient. No language interpreter was used.  Pelvic Pain This is a recurrent problem. The problem occurs constantly. The problem has been gradually worsening. Associated symptoms include abdominal pain. Pertinent negatives include no chest pain, no headaches and no shortness of breath. Nothing aggravates the symptoms. Nothing relieves the symptoms.   HPI Comments: Lynn Spencer is a 28 y.o. female, brought in by EMS, with a history of chronic neck and spine pain, who presents to the Emergency Department complaining of moderate constant pelvic pain that radiates to her back which waxes and wanes. She states that her pain woke her up from sleep. She states that she has associated bilateral lower abdominal pain. She states that she has more pain in her left side. She denies history of similar pain. She currently rates her pain as 10/10. She reports associated emesis, chills, frequent urination, dysuria. LNMP was 3 weeks ago. She reports associated yellow vaginal discharge. Patient has had 1 sexual partner for the past 2 years. Patient states that she has history of gonorrhea and chlamydia years ago. Patient reports history of abnormal pap smears. Patient states that she has had a biopsy for cervical cancer in the past. She states that the biopsy was negative. Patient states that she has had 5 pregnancies and has 5 children. Patient is not currently taking birth control medications. Although the patient initially stated that the pain was sudden onset that woke her she now states that she has had  urinary frequency, urgency and burning for the past 3 or 4 days that she has just been dealing with. Patient took Oxycodone 10 mg at home without relief.   Past Medical History  Diagnosis Date  . Hx of gonorrhea   . Hx of chlamydia infection   . Hx of syphilis   . HPV (human papilloma virus) infection   . Abnormal Pap smear   . Anxiety   . Depression   . Headache(784.0)   . Hemorrhoids   . Constipation   . Pregnant 05/27/2013  . Diabetes mellitus without complication    Past Surgical History  Procedure Laterality Date  . Colposcopy  2009  . No past surgeries     Family History  Problem Relation Age of Onset  . Alcohol abuse Father   . Asthma Maternal Grandmother   . Diabetes Maternal Grandmother   . Hypertension Maternal Grandmother    History  Substance Use Topics  . Smoking status: Current Every Day Smoker -- 0.50 packs/day for 10 years    Types: Cigarettes  . Smokeless tobacco: Never Used  . Alcohol Use: Yes     Comment: Occ; not now   OB History   Grav Para Term Preterm Abortions TAB SAB Ect Mult Living   6 5 5  1 1    5      Review of Systems  Constitutional: Positive for fever and chills.  HENT: Negative.   Eyes: Negative for visual disturbance.  Respiratory: Negative for shortness of breath.   Cardiovascular: Negative for chest pain.  Gastrointestinal: Positive for  nausea, vomiting and abdominal pain.  Genitourinary: Positive for dysuria, urgency, frequency, flank pain, vaginal discharge and pelvic pain. Negative for vaginal bleeding and menstrual problem.  Musculoskeletal: Positive for back pain. Neck pain: chronic.  Skin: Negative for rash.  Neurological: Negative for dizziness, syncope and headaches.  Psychiatric/Behavioral: Negative for confusion. The patient is nervous/anxious.   All other systems reviewed and are negative.  10 Systems reviewed and are negative for acute change except as noted in the HPI.  Allergies  Dimetapp cold-allergy;  Hydrocodone; Robitussin (alcohol free); and Tramadol  Home Medications   Prior to Admission medications   Medication Sig Start Date End Date Taking? Authorizing Provider  Aspirin-Acetaminophen-Caffeine (GOODY HEADACHE PO) Take 1 Package by mouth daily as needed (pain).   Yes Historical Provider, MD  diclofenac (VOLTAREN) 75 MG EC tablet Take 75 mg by mouth 2 (two) times daily.   Yes Historical Provider, MD  OVER THE COUNTER MEDICATION Apply 1 patch topically daily as needed (back pain).   Yes Historical Provider, MD  Oxycodone HCl 10 MG TABS Take 5-10 mg by mouth every 8 (eight) hours.    Yes Historical Provider, MD  tiZANidine (ZANAFLEX) 4 MG tablet Take 8 mg by mouth at bedtime as needed for muscle spasms.   Yes Historical Provider, MD   Triage Vitals: BP 130/85  Pulse 82  Temp(Src) 98.5 F (36.9 C) (Oral)  Resp 18  SpO2 99%  LMP 06/20/2014  Physical Exam  Nursing note and vitals reviewed. Constitutional: She is oriented to person, place, and time. She appears well-developed and well-nourished. No distress.  HENT:  Head: Normocephalic and atraumatic.  Eyes: Conjunctivae and EOM are normal.  Neck: Neck supple. No tracheal deviation present.  Cardiovascular: Normal rate, regular rhythm and normal heart sounds.  Exam reveals no gallop and no friction rub.   No murmur heard. Pulmonary/Chest: Effort normal and breath sounds normal. No respiratory distress.  Abdominal: Soft. Bowel sounds are normal. There is tenderness in the suprapubic area and left lower quadrant. There is CVA tenderness (left).  LLQ is tender with palpation.  Genitourinary: Vaginal discharge found.  Light discharge in vaginal vault Minimal cervical motion tenderness Yellow discharge from cervix.  Musculoskeletal: Normal range of motion. She exhibits tenderness.  Neurological: She is alert and oriented to person, place, and time.  Skin: Skin is warm and dry.  Psychiatric: She has a normal mood and affect. Her  behavior is normal.   ED Course  Procedures (including critical care time) Labs, IV fluids, Zofran 4 mg IV, Toradol 30 mg IV, Dilaudid 1 mg. IV, Rocephin 1 gram IV Vital signs repeated and patient is febrile. BP 105/53  Pulse 84  Temp(Src) 101.5 F (38.6 C) (Oral)  Resp 18  SpO2 98%  LMP 06/20/2014   DIAGNOSTIC STUDIES: Oxygen Saturation is 99% on RA, normal by my interpretation.    COORDINATION OF CARE: 8:54 AM- Discussed plans to give patient Toradol for pain management. Will conduct pelvic exam. Pt advised of plan for treatment and pt agrees.  Labs Review Results for orders placed during the hospital encounter of 07/12/14 (from the past 24 hour(s))  URINALYSIS, ROUTINE W REFLEX MICROSCOPIC     Status: Abnormal   Collection Time    07/12/14  8:13 AM      Result Value Ref Range   Color, Urine YELLOW  YELLOW   APPearance HAZY (*) CLEAR   Specific Gravity, Urine 1.010  1.005 - 1.030   pH 6.5  5.0 - 8.0  Glucose, UA NEGATIVE  NEGATIVE mg/dL   Hgb urine dipstick MODERATE (*) NEGATIVE   Bilirubin Urine NEGATIVE  NEGATIVE   Ketones, ur NEGATIVE  NEGATIVE mg/dL   Protein, ur 30 (*) NEGATIVE mg/dL   Urobilinogen, UA 0.2  0.0 - 1.0 mg/dL   Nitrite POSITIVE (*) NEGATIVE   Leukocytes, UA LARGE (*) NEGATIVE  PREGNANCY, URINE     Status: None   Collection Time    07/12/14  8:13 AM      Result Value Ref Range   Preg Test, Ur NEGATIVE  NEGATIVE  URINE MICROSCOPIC-ADD ON     Status: Abnormal   Collection Time    07/12/14  8:13 AM      Result Value Ref Range   Squamous Epithelial / LPF RARE  RARE   WBC, UA TOO NUMEROUS TO COUNT  <3 WBC/hpf   RBC / HPF TOO NUMEROUS TO COUNT  <3 RBC/hpf   Bacteria, UA MANY (*) RARE  CBC WITH DIFFERENTIAL     Status: Abnormal   Collection Time    07/12/14  8:29 AM      Result Value Ref Range   WBC 23.3 (*) 4.0 - 10.5 K/uL   RBC 4.55  3.87 - 5.11 MIL/uL   Hemoglobin 13.9  12.0 - 15.0 g/dL   HCT 53.640.7  64.436.0 - 03.446.0 %   MCV 89.5  78.0 - 100.0 fL    MCH 30.5  26.0 - 34.0 pg   MCHC 34.2  30.0 - 36.0 g/dL   RDW 74.213.0  59.511.5 - 63.815.5 %   Platelets 336  150 - 400 K/uL   Neutrophils Relative % 84 (*) 43 - 77 %   Neutro Abs 19.6 (*) 1.7 - 7.7 K/uL   Lymphocytes Relative 12  12 - 46 %   Lymphs Abs 2.8  0.7 - 4.0 K/uL   Monocytes Relative 4  3 - 12 %   Monocytes Absolute 0.9  0.1 - 1.0 K/uL   Eosinophils Relative 0  0 - 5 %   Eosinophils Absolute 0.0  0.0 - 0.7 K/uL   Basophils Relative 0  0 - 1 %   Basophils Absolute 0.0  0.0 - 0.1 K/uL  COMPREHENSIVE METABOLIC PANEL     Status: Abnormal   Collection Time    07/12/14  8:29 AM      Result Value Ref Range   Sodium 140  137 - 147 mEq/L   Potassium 3.6 (*) 3.7 - 5.3 mEq/L   Chloride 100  96 - 112 mEq/L   CO2 23  19 - 32 mEq/L   Glucose, Bld 122 (*) 70 - 99 mg/dL   BUN 9  6 - 23 mg/dL   Creatinine, Ser 7.560.80  0.50 - 1.10 mg/dL   Calcium 9.5  8.4 - 43.310.5 mg/dL   Total Protein 8.0  6.0 - 8.3 g/dL   Albumin 4.2  3.5 - 5.2 g/dL   AST 14  0 - 37 U/L   ALT 14  0 - 35 U/L   Alkaline Phosphatase 69  39 - 117 U/L   Total Bilirubin 0.5  0.3 - 1.2 mg/dL   GFR calc non Af Amer >90  >90 mL/min   GFR calc Af Amer >90  >90 mL/min   Anion gap 17 (*) 5 - 15  WET PREP, GENITAL     Status: Abnormal   Collection Time    07/12/14  9:02 AM      Result Value Ref Range  Yeast Wet Prep HPF POC NONE SEEN  NONE SEEN   Trich, Wet Prep NONE SEEN  NONE SEEN   Clue Cells Wet Prep HPF POC MANY (*) NONE SEEN   WBC, Wet Prep HPF POC MANY (*) NONE SEEN   After IV hydration, medications for nausea, pain management and IV antibiotics patient is feeling better. She is taking PO fluids without nausea. Rocephin 1 gram IV given while patient in the ED.  MDM  28 y.o. female with pelvic pain, left flank pain, nausea, vomiting, fever, chills and UTI symptoms. Will treat for pyelonephritis and BV. Stable for discharge without n/v or dehydration at this time. Will treat with antibiotics, medications for nausea and she will  take her pain medication that she has at home as needed. She will follow up with her PCP or return here as needed for worsening symptoms.  Discussed with the patient and all questioned fully answered.    Medication List    TAKE these medications       cephALEXin 500 MG capsule  Commonly known as:  KEFLEX  Take 1 capsule (500 mg total) by mouth 4 (four) times daily.     metroNIDAZOLE 0.75 % vaginal gel  Commonly known as:  METROGEL VAGINAL  Place 1 Applicatorful vaginally 2 (two) times daily.     ondansetron 4 MG disintegrating tablet  Commonly known as:  ZOFRAN ODT  Take 1 tablet (4 mg total) by mouth every 8 (eight) hours as needed for nausea or vomiting.     promethazine 25 MG suppository  Commonly known as:  PHENERGAN  Place 0.5 suppositories (12.5 mg total) rectally every 6 (six) hours as needed for nausea or vomiting.      ASK your doctor about these medications       diclofenac 75 MG EC tablet  Commonly known as:  VOLTAREN  Take 75 mg by mouth 2 (two) times daily.     GOODY HEADACHE PO  Take 1 Package by mouth daily as needed (pain).     OVER THE COUNTER MEDICATION  Apply 1 patch topically daily as needed (back pain).     Oxycodone HCl 10 MG Tabs  Take 5-10 mg by mouth every 8 (eight) hours.     tiZANidine 4 MG tablet  Commonly known as:  ZANAFLEX  Take 8 mg by mouth at bedtime as needed for muscle spasms.         I personally performed the services described in this documentation, which was scribed in my presence. The recorded information has been reviewed and is accurate.  Deadra Diggins Orlene Och, NP 07/12/14 1440

## 2014-07-13 LAB — HIV ANTIBODY (ROUTINE TESTING W REFLEX): HIV 1&2 Ab, 4th Generation: NONREACTIVE

## 2014-07-14 ENCOUNTER — Encounter (HOSPITAL_COMMUNITY): Payer: Self-pay | Admitting: Emergency Medicine

## 2014-07-15 LAB — URINE CULTURE

## 2014-07-15 LAB — GC/CHLAMYDIA PROBE AMP
CT PROBE, AMP APTIMA: NEGATIVE
GC PROBE AMP APTIMA: NEGATIVE

## 2014-07-16 ENCOUNTER — Telehealth (HOSPITAL_BASED_OUTPATIENT_CLINIC_OR_DEPARTMENT_OTHER): Payer: Self-pay | Admitting: Emergency Medicine

## 2014-07-16 NOTE — Telephone Encounter (Signed)
Post ED Visit - Positive Culture Follow-up  Culture report reviewed by antimicrobial stewardship pharmacist: []  Wes Dulaney, Pharm.D., BCPS []  Celedonio MiyamotoJeremy Frens, Pharm.D., BCPS []  Georgina PillionElizabeth Martin, Pharm.D., BCPS []  GastonMinh Pham, [address] Rainbow BoulevardPharm.D., BCPS, AAHIVP []  Estella HuskMichelle Turner, Pharm.D., BCPS, AAHIVP [x]  Babs BertinHaley Baird, Pharm.D.   Positive urine culture E. Coli Treated with cephalexin,sensitive to same and no further treatment is indicated at this time.  Berle MullMiller, Elener Custodio 07/16/2014, 12:02 PM

## 2015-03-09 ENCOUNTER — Other Ambulatory Visit: Payer: Self-pay

## 2015-08-12 ENCOUNTER — Encounter (HOSPITAL_COMMUNITY): Payer: Self-pay | Admitting: Emergency Medicine

## 2015-08-12 ENCOUNTER — Emergency Department (HOSPITAL_COMMUNITY)
Admission: EM | Admit: 2015-08-12 | Discharge: 2015-08-13 | Disposition: A | Discharge status: Home / Self Care | Payer: Medicaid Other | Attending: Emergency Medicine | Admitting: Emergency Medicine

## 2015-08-12 DIAGNOSIS — Z791 Long term (current) use of non-steroidal anti-inflammatories (NSAID): Secondary | ICD-10-CM | POA: Diagnosis not present

## 2015-08-12 DIAGNOSIS — M545 Low back pain, unspecified: Secondary | ICD-10-CM

## 2015-08-12 DIAGNOSIS — F419 Anxiety disorder, unspecified: Secondary | ICD-10-CM | POA: Diagnosis not present

## 2015-08-12 DIAGNOSIS — Z3202 Encounter for pregnancy test, result negative: Secondary | ICD-10-CM | POA: Diagnosis not present

## 2015-08-12 DIAGNOSIS — R109 Unspecified abdominal pain: Secondary | ICD-10-CM

## 2015-08-12 DIAGNOSIS — E119 Type 2 diabetes mellitus without complications: Secondary | ICD-10-CM | POA: Diagnosis not present

## 2015-08-12 DIAGNOSIS — F17210 Nicotine dependence, cigarettes, uncomplicated: Secondary | ICD-10-CM | POA: Insufficient documentation

## 2015-08-12 DIAGNOSIS — K2980 Duodenitis without bleeding: Secondary | ICD-10-CM | POA: Diagnosis not present

## 2015-08-12 DIAGNOSIS — Z79899 Other long term (current) drug therapy: Secondary | ICD-10-CM | POA: Diagnosis not present

## 2015-08-12 DIAGNOSIS — F329 Major depressive disorder, single episode, unspecified: Secondary | ICD-10-CM | POA: Diagnosis not present

## 2015-08-12 DIAGNOSIS — R1110 Vomiting, unspecified: Secondary | ICD-10-CM

## 2015-08-12 DIAGNOSIS — R51 Headache: Secondary | ICD-10-CM | POA: Insufficient documentation

## 2015-08-12 DIAGNOSIS — Z8619 Personal history of other infectious and parasitic diseases: Secondary | ICD-10-CM | POA: Insufficient documentation

## 2015-08-12 DIAGNOSIS — R197 Diarrhea, unspecified: Secondary | ICD-10-CM

## 2015-08-12 DIAGNOSIS — R1030 Lower abdominal pain, unspecified: Secondary | ICD-10-CM | POA: Diagnosis present

## 2015-08-12 HISTORY — DX: Other cervical disc degeneration, unspecified cervical region: M50.30

## 2015-08-12 LAB — CBC WITH DIFFERENTIAL/PLATELET
Basophils Absolute: 0 10*3/uL (ref 0.0–0.1)
Basophils Relative: 0 %
EOS ABS: 0.2 10*3/uL (ref 0.0–0.7)
EOS PCT: 2 %
HCT: 40.5 % (ref 36.0–46.0)
Hemoglobin: 13.8 g/dL (ref 12.0–15.0)
LYMPHS ABS: 3.6 10*3/uL (ref 0.7–4.0)
Lymphocytes Relative: 31 %
MCH: 31.5 pg (ref 26.0–34.0)
MCHC: 34.1 g/dL (ref 30.0–36.0)
MCV: 92.5 fL (ref 78.0–100.0)
MONOS PCT: 4 %
Monocytes Absolute: 0.4 10*3/uL (ref 0.1–1.0)
Neutro Abs: 7.4 10*3/uL (ref 1.7–7.7)
Neutrophils Relative %: 63 %
PLATELETS: 295 10*3/uL (ref 150–400)
RBC: 4.38 MIL/uL (ref 3.87–5.11)
RDW: 12.2 % (ref 11.5–15.5)
WBC: 11.7 10*3/uL — ABNORMAL HIGH (ref 4.0–10.5)

## 2015-08-12 MED ORDER — FENTANYL CITRATE (PF) 100 MCG/2ML IJ SOLN
50.0000 ug | Freq: Once | INTRAMUSCULAR | Status: AC
  Administered 2015-08-12: 50 ug via INTRAVENOUS
  Filled 2015-08-12: qty 2

## 2015-08-12 MED ORDER — PROCHLORPERAZINE EDISYLATE 5 MG/ML IJ SOLN
5.0000 mg | Freq: Once | INTRAMUSCULAR | Status: AC
  Administered 2015-08-12: 5 mg via INTRAVENOUS
  Filled 2015-08-12: qty 2

## 2015-08-12 NOTE — ED Notes (Signed)
Pt states she began having abd pain today, has been having N/V/D/ throughout the day. Pt states she has been able to drink and eat normally, denies fever.

## 2015-08-12 NOTE — ED Provider Notes (Signed)
CSN: [id number]     Arrival date & time 08/12/15  2151 History   First MD Initiated Contact with Patient 08/12/15 2311     Chief Complaint  Patient presents with  . Abdominal Pain     (Consider location/radiation/quality/duration/timing/severity/associated sxs/prior Treatment) HPI Comments: Patient is a 29 year old female who presents to the emergency department with a complaint of abdominal pain.  The patient states that she has been having some nausea vomiting and diarrhea throughout the day today. She also reports abdominal pain throughout the day. This evening she noted a severe pain in the left mid to lower abdomen, that doubled her over. She states that at time she was having vomiting and diarrhea at the same time. She states that she has been eating and drinking normally during the day. She denies any high fevers. His been no recent injury or trauma to the abdomen. There's been no recent changes in medications or diet.  Patient is a 29 y.o. female presenting with abdominal pain. The history is provided by the patient.  Abdominal Pain Associated symptoms: diarrhea, nausea and vomiting     Past Medical History  Diagnosis Date  . Hx of gonorrhea   . Hx of chlamydia infection   . Hx of syphilis   . HPV (human papilloma virus) infection   . Abnormal Pap smear   . Anxiety   . Depression   . Headache(784.0)   . Hemorrhoids   . Constipation   . Pregnant 05/27/2013  . Diabetes mellitus without complication (HCC)   . Degenerative disc disease, cervical    Past Surgical History  Procedure Laterality Date  . Colposcopy  2009  . No past surgeries     Family History  Problem Relation Age of Onset  . Alcohol abuse Father   . Asthma Maternal Grandmother   . Diabetes Maternal Grandmother   . Hypertension Maternal Grandmother    Social History  Substance Use Topics  . Smoking status: Current Every Day Smoker -- 0.50 packs/day for 10 years    Types: Cigarettes  . Smokeless  tobacco: Never Used  . Alcohol Use: No     Comment: Occ; not now   OB History    Gravida Para Term Preterm AB TAB SAB Ectopic Multiple Living   Review of Systems  Gastrointestinal: Positive for nausea, vomiting, abdominal pain and diarrhea.  Neurological: Positive for headaches.  Psychiatric/Behavioral: The patient is nervous/anxious.   All other systems reviewed and are negative.     Allergies  Dimetapp cold-allergy; Hydrocodone; Robitussin (alcohol free); and Tramadol  Home Medications   Prior to Admission medications   Medication Sig Start Date End Date Taking? Authorizing Provider  ALPRAZolam Prudy Feeler) 0.5 MG tablet Take 0.5 mg by mouth 3 (three) times daily as needed for anxiety.   Yes Historical Provider, MD  carisoprodol (SOMA) 350 MG tablet Take 350 mg by mouth at bedtime.    Yes Historical Provider, MD  diclofenac sodium (VOLTAREN) 1 % GEL Apply 2 g topically 4 (four) times daily.   Yes Historical Provider, MD  DULoxetine (CYMBALTA) 60 MG capsule Take 60 mg by mouth 2 (two) times daily.   Yes Historical Provider, MD  Oxycodone HCl 10 MG TABS Take 10 mg by mouth 5 (five) times daily.    Yes Historical Provider, MD  tiZANidine (ZANAFLEX) 4 MG tablet Take 8 mg by mouth at bedtime as needed for muscle spasms.  Yes Historical Provider, MD  Aspirin-Acetaminophen-Caffeine (GOODY HEADACHE PO) Take 1 Package by mouth daily as needed (pain).    Historical Provider, MD  cephALEXin (KEFLEX) 500 MG capsule Take 1 capsule (500 mg total) by mouth 4 (four) times daily. Patient not taking: Reported on 08/12/2015 07/12/14   Janne Napoleon, NP  metroNIDAZOLE (METROGEL VAGINAL) 0.75 % vaginal gel Place 1 Applicatorful vaginally 2 (two) times daily. Patient not taking: Reported on 08/12/2015 07/12/14   Janne Napoleon, NP  ondansetron (ZOFRAN ODT) 4 MG disintegrating tablet Take 1 tablet (4 mg total) by mouth every 8 (eight) hours as needed for nausea or vomiting. Patient not  taking: Reported on 08/12/2015 07/12/14   Janne Napoleon, NP  promethazine (PHENERGAN) 25 MG suppository Place 0.5 suppositories (12.5 mg total) rectally every 6 (six) hours as needed for nausea or vomiting. Patient not taking: Reported on 08/12/2015 07/12/14   Hope Orlene Och, NP   BP 142/74 mmHg  Pulse 90  Temp(Src) 97.6 F (36.4 C) (Oral)  Resp 24  Ht  (1.651 m)  Wt 54.432 kg  BMI 19.97 kg/m2  SpO2 100%  LMP 08/12/2015 Physical Exam  Constitutional: She is oriented to person, place, and time. She appears well-developed and well-nourished.  Non-toxic appearance.  HENT:  Head: Normocephalic.  Right Ear: Tympanic membrane and external ear normal.  Left Ear: Tympanic membrane and external ear normal.  Eyes: EOM and lids are normal. Pupils are equal, round, and reactive to light.  Neck: Normal range of motion. Neck supple. Carotid bruit is not present.  Cardiovascular: Normal rate, regular rhythm, normal heart sounds, intact distal pulses and normal pulses.   Pulmonary/Chest: Breath sounds normal. No respiratory distress.  Abdominal: Soft. Bowel sounds are normal. She exhibits no ascites and no mass. There is no hepatosplenomegaly. There is tenderness in the suprapubic area and left lower quadrant. There is no guarding.  Musculoskeletal: Normal range of motion.  Lymphadenopathy:       Head (right side): No submandibular adenopathy present.       Head (left side): No submandibular adenopathy present.    She has no cervical adenopathy.  Neurological: She is alert and oriented to person, place, and time. She has normal strength. No cranial nerve deficit or sensory deficit.  Skin: Skin is warm and dry.  Psychiatric: She has a normal mood and affect. Her speech is normal.  Nursing note and vitals reviewed.   ED Course  Procedures (including critical care time) Labs Review Labs Reviewed - No data to display  Imaging Review No results found. I have personally reviewed and evaluated  these images and lab results as part of my medical decision-making.   EKG Interpretation None      MDM  Vital signs are well within normal limits. The complete blood count reveals the white blood cell count to be slightly elevated at 11,700, otherwise within normal limits. Components of metabolic panel is well within normal limits. Lipase is normal at 29. Urine pregnancy test is negative.  Patient states that she got a short period of relief from fentanyl 50 g. Patient is requesting additional pain medication, and fentanyl 100 g given, along with a liter of IV fluids. The patient will have a CT scan of the abdomen and pelvis to evaluate for colitis, kidney stone. Patient reports that she has been having problems with " four smelling burps and vomitus" , will observe for crohn's disease or GI infection. Pt care to be continued by Dr  Lars MageI. Knapp.   Final diagnoses:  Abdominal pain, unspecified abdominal location  Duodenitis  Midline low back pain without sciatica  Vomiting and diarrhea    *I have reviewed nursing notes, vital signs, and all appropriate lab and imaging results for this patient.Ivery Quale**    Alassane Kalafut, PA-C 08/13/15 1410  Devoria AlbeIva Knapp, MD 08/14/15 [phone]

## 2015-08-13 ENCOUNTER — Emergency Department (HOSPITAL_COMMUNITY): Payer: Medicaid Other

## 2015-08-13 LAB — COMPREHENSIVE METABOLIC PANEL
ALBUMIN: 4.3 g/dL (ref 3.5–5.0)
ALT: 16 U/L (ref 14–54)
AST: 16 U/L (ref 15–41)
Alkaline Phosphatase: 54 U/L (ref 38–126)
Anion gap: 8 (ref 5–15)
BUN: 12 mg/dL (ref 6–20)
CALCIUM: 8.9 mg/dL (ref 8.9–10.3)
CO2: 26 mmol/L (ref 22–32)
Chloride: 105 mmol/L (ref 101–111)
Creatinine, Ser: 0.72 mg/dL (ref 0.44–1.00)
GFR calc non Af Amer: >60 mL/min (ref >60)
GLUCOSE: 94 mg/dL (ref 65–99)
POTASSIUM: 3.8 mmol/L (ref 3.5–5.1)
SODIUM: 139 mmol/L (ref 135–145)
TOTAL PROTEIN: 7.1 g/dL (ref 6.5–8.1)
Total Bilirubin: 0.8 mg/dL (ref 0.3–1.2)

## 2015-08-13 LAB — URINE MICROSCOPIC-ADD ON

## 2015-08-13 LAB — URINALYSIS, ROUTINE W REFLEX MICROSCOPIC
BILIRUBIN URINE: NEGATIVE
Glucose, UA: NEGATIVE mg/dL
KETONES UR: NEGATIVE mg/dL
LEUKOCYTES UA: NEGATIVE
NITRITE: NEGATIVE
Protein, ur: NEGATIVE mg/dL
Specific Gravity, Urine: 1.025 (ref 1.005–1.030)
pH: 6 (ref 5.0–8.0)

## 2015-08-13 LAB — PREGNANCY, URINE: Preg Test, Ur: NEGATIVE

## 2015-08-13 LAB — LIPASE, BLOOD: LIPASE: 29 U/L (ref 11–51)

## 2015-08-13 MED ORDER — ONDANSETRON HCL 4 MG PO TABS: 4.0000 mg | ORAL_TABLET | Freq: Three times a day (TID) | ORAL | Status: DC | PRN

## 2015-08-13 MED ORDER — SODIUM CHLORIDE 0.9 % IV BOLUS (SEPSIS)
1000.0000 mL | Freq: Once | INTRAVENOUS | Status: AC
  Administered 2015-08-13: 1000 mL via INTRAVENOUS

## 2015-08-13 MED ORDER — FENTANYL CITRATE (PF) 100 MCG/2ML IJ SOLN
50.0000 ug | Freq: Once | INTRAMUSCULAR | Status: AC
  Administered 2015-08-13: 50 ug via INTRAVENOUS
  Filled 2015-08-13: qty 2

## 2015-08-13 MED ORDER — PANTOPRAZOLE SODIUM 40 MG IV SOLR
40.0000 mg | Freq: Once | INTRAVENOUS | Status: AC
  Administered 2015-08-13: 40 mg via INTRAVENOUS
  Filled 2015-08-13: qty 40

## 2015-08-13 MED ORDER — FENTANYL CITRATE (PF) 100 MCG/2ML IJ SOLN
100.0000 ug | Freq: Once | INTRAMUSCULAR | Status: AC
  Administered 2015-08-13: 100 ug via INTRAVENOUS
  Filled 2015-08-13: qty 2

## 2015-08-13 MED ORDER — IOHEXOL 300 MG/ML  SOLN
100.0000 mL | Freq: Once | INTRAMUSCULAR | Status: AC | PRN
  Administered 2015-08-13: 100 mL via INTRAVENOUS

## 2015-08-13 MED ORDER — BARIUM SULFATE 2 % PO SUSP
450.0000 mL | Freq: Once | ORAL | Status: DC
Start: 2015-08-13 — End: 2015-08-13
  Filled 2015-08-13: qty 450

## 2015-08-13 MED ORDER — OMEPRAZOLE 20 MG PO CPDR: DELAYED_RELEASE_CAPSULE | ORAL | Status: DC

## 2015-08-13 MED ORDER — SODIUM CHLORIDE 0.9 % IV SOLN: 1000.0000 mL | INTRAVENOUS | Status: DC

## 2015-08-13 MED ORDER — SODIUM CHLORIDE 0.9 % IV SOLN
1000.0000 mL | Freq: Once | INTRAVENOUS | Status: AC
  Administered 2015-08-13: 1000 mL via INTRAVENOUS

## 2015-08-13 NOTE — ED Provider Notes (Addendum)
Patient presents with nonbloody vomiting and diarrhea today with worsening mid abdominal pain. Patient states she takes over-the-counter Tylenol and Goody's, she does smoke but she does not drink alcohol. We discussed stopping the Goody's and trying to cut back on the smoking. We discussed referral to gastroenterology for further evaluation of her pain. She will be discharged on a PPI for her duodenitis.  3:20 AM I reviewed patient's CT scan which showed duodenitis. She was given IV protonix.   Recheck at 5 AM. Patient again asked me what was wrong. I explained it again. We discussed taking the omeprazole following up with the gastroenterologist on call. We discussed avoiding nonsteroidal anti-inflammatory drugs and to try to quit smoking. She was advised to avoid fried, spicy or greasy foods. Patient's main complaint to me now is that her back hurts. She states she's never had it before. However it has been getting worse recently.  Review the [place] database shows patient was getting #150 oxycodone 10 mg tablets monthly the last time was September 25 from [facility] in [place]. She also was prescribed #90 Soma 350 mg tablets on November 8. She also gets #90 alprazolam 0.5 mg tablets monthly, the last field was November 7. These are also prescribed by [facility].   Ct Abdomen Pelvis W Contrast  08/13/2015  CLINICAL DATA:  29 year old female with sharp intermittent left-sided flank pain nausea and vomiting EXAM: CT ABDOMEN AND PELVIS WITH CONTRAST TECHNIQUE: Multidetector CT imaging of the abdomen and pelvis was performed using the standard protocol following bolus administration of intravenous contrast. CONTRAST:  100mL OMNIPAQUE IOHEXOL 300 MG/ML  SOLN COMPARISON:  None. FINDINGS: Evaluation of this exam is limited due to respiratory motion artifact. The visualized lung bases are clear. No intra-abdominal free air. Trace free fluid within the pelvis. The liver,  gallbladder, pancreas, spleen, adrenal glands appear unremarkable. Subcentimeter left renal hypodense lesion is too small to characterize. A punctate nonobstructing left renal calculus noted. There is no hydronephrosis or obstructing stone on either side. The visualized ureters and urinary bladder appear unremarkable. The uterus is anteverted and grossly unremarkable. The visualized ovaries appear unremarkable. Oral contrast is noted within the stomach and loops of mid small bowel. There is thickened appearance of the proximal duodenal loop in the right hemiabdomen likely representing duodenitis. Other etiologies including mural hematoma or an infiltrative process is less likely but not entirely excluded. Evaluation of this loop of duodenum is very limited due to suboptimal opacification with contrast. Clinical correlation and follow-up recommended. There is no evidence of bowel obstruction. The appendix is unremarkable. The abdominal aorta and IVC appear unremarkable. No portal venous gas identified. There is no lymphadenopathy. The abdominal wall soft tissues and visualized osseous structures appear grossly unremarkable. IMPRESSION: Thickened loops of proximal small bowel and duodenum in the right hemi abdomen most compatible with duodenitis. Other etiologies including mural hematoma or infiltrative process is less likely but not entirely excluded. Clinical correlation and follow-up recommended. No bowel obstruction. Normal appendix. Electronically Signed   By: Elgie CollardArash  Radparvar M.D.   On: 08/13/2015 02:44   Diagnoses that have been ruled out:  None  Diagnoses that are still under consideration:  None  Final diagnoses:  Abdominal pain, unspecified abdominal location  Duodenitis  Midline low back pain without sciatica  Vomiting and diarrhea    New Prescriptions   OMEPRAZOLE (PRILOSEC) 20 MG CAPSULE    Take 1 po BID x 2 weeks then once a day   ONDANSETRON (ZOFRAN)  4 MG TABLET    Take 1 tablet (4 mg  total) by mouth every 8 (eight) hours as needed for nausea or vomiting.    Plan discharge  Devoria Albe, MD, FACEP   Medical screening examination/treatment/procedure(s) were conducted as a shared visit with non-physician practitioner(s) and myself.  I personally evaluated the patient during the encounter.   EKG Interpretation None       Devoria Albe, MD, Concha Pyo, MD 08/13/15 [id number]  Devoria Albe, MD 08/13/15 [phone]

## 2015-08-13 NOTE — Discharge Instructions (Signed)
Drink plenty of fluids. Avoid fried, spicy, or greasy foods. Do not take aspirin, Motrin, Goody's, ibuprofen for pain. You can take Tylenol for your back pain. Try to quit smoking. Start the omeprazole for the inflammation of your small bowel called duodenitis. Call the gastroenterologist on call's office, Dr. Darrick Penna, to get a follow-up appointment. You can talk to your chronic pain management doctor about your back pain.   Duodenitis Duodenitis is inflammation of the lining of the first part of your small intestine (duodenum). There are two types of duodenitis:  Acute duodenitis (develops suddenly and is short lived).   Chronic duodenitis (develops over an extended period and lasts months to years). CAUSES  Duodenitis is most often caused by infection with the bacterium Helicobacter pylori (H. pylori). H. pylori increases the production of stomach acid and causes changes in the environment of the duodenum. This irritates and damages the cells of the duodenum causing inflammation. Other causes of duodenitis include:   Long-term use of nonsteroidal anti-inflammatory drugs (NSAIDs). NSAIDs change the lining of the duodenum and make it more prone to injury from stomach acid.  Excessive use of alcohol. Alcohol increases stomach acid and changes the lining of the duodenum which makes it more likely for inflammation to develop.  Giardiasis. Giardiasis is a common infection of the small intestine. It can cause inflammation of the duodenum.   Other gastrointestinal disorders, such as Crohn disease. People with these disorders are more likely to develop duodenitis. SYMPTOMS  Although duodenitis does not always cause symptoms, symptoms that do occur include:  Nausea or vomiting.  Gassy, bloated feeling or an uncomfortable feeling of fullness after eating.  Burning, cramps, or pain in the upper abdominal area. DIAGNOSIS  To diagnose duodenitis, your health care provider may use results from:    An exam of the duodenum using a thin tube with a tiny camera on the tip, which is placed down your throat (endoscope). The endoscope is passed through your stomach and into your duodenum. Sometimes a sample of tissue from your duodenum is removed with the endoscope. The sample is then examined under a microscope (biopsy) for signs of inflammation and H. pylori infection.   Tests that check samples of your blood or stool for H. pylori infection.   A test that checks the gases in a sample of your expired breath for H. pylori infection. The test measures the levels of carbon dioxide in your breath after you drink a special solution.  An X-ray exam using a special liquid that you swallow to illuminate your digestive tract (barium) to show signs of inflammation. TREATMENT  Treatment will depend on the cause of the duodenitis. The most common treatments include:  Use of medication to treat infection.  Medication to reduce stomach acid.  Discontinuing the use of NSAIDs.  Management of other gastrointestinal conditions.  Avoiding alcohol consumption. Additionally, taking the following steps can help to reduce the severity of your symptoms:  Drink enough water to keep your urine clear or pale yellow.  Avoid consuming these foods or drinks:  Caffeinated drinks.  Chocolate.  Peppermint or mint-flavored food or drinks.  Garlic.  Onions.  Spicy foods.  Citrus fruits, such as oranges, lemons, or limes.  Foods that use tomato-based sauces, such as pasta sauce, chili, salsa, and pizza.  Fatty foods.  Fried foods.   This information is not intended to replace advice given to you by your health care provider. Make sure you discuss any questions you have with your  health care provider.   Document Released: 12/24/2012 Document Revised: 09/19/2014 Document Reviewed: 12/24/2012 Elsevier Interactive Patient Education [employer]2016 Elsevier Inc.

## 2015-08-14 LAB — H. PYLORI ANTIBODY, IGG: H Pylori IgG: <0.9 U/mL (ref 0.0–0.8)

## 2015-08-19 ENCOUNTER — Ambulatory Visit: Payer: Medicaid Other | Admitting: Family Medicine

## 2015-08-20 ENCOUNTER — Encounter: Payer: Self-pay | Admitting: Family Medicine

## 2015-11-30 ENCOUNTER — Ambulatory Visit (HOSPITAL_COMMUNITY)
Admission: RE | Admit: 2015-11-30 | Discharge: 2015-11-30 | Disposition: A | Discharge status: Home / Self Care | Payer: Medicaid Other | Source: Ambulatory Visit | Attending: Specialist | Admitting: Specialist

## 2015-11-30 ENCOUNTER — Other Ambulatory Visit (HOSPITAL_COMMUNITY): Payer: Self-pay | Admitting: Specialist

## 2015-11-30 DIAGNOSIS — M545 Low back pain: Secondary | ICD-10-CM | POA: Diagnosis present

## 2015-11-30 DIAGNOSIS — M5442 Lumbago with sciatica, left side: Secondary | ICD-10-CM

## 2015-11-30 LAB — POCT PREGNANCY, URINE: PREG TEST UR: NEGATIVE

## 2016-02-15 ENCOUNTER — Emergency Department (HOSPITAL_COMMUNITY)
Admission: EM | Admit: 2016-02-15 | Discharge: 2016-02-16 | Disposition: A | Discharge status: Home / Self Care | Payer: Medicaid Other | Attending: Emergency Medicine | Admitting: Emergency Medicine

## 2016-02-15 ENCOUNTER — Encounter (HOSPITAL_COMMUNITY): Payer: Self-pay | Admitting: Emergency Medicine

## 2016-02-15 DIAGNOSIS — E119 Type 2 diabetes mellitus without complications: Secondary | ICD-10-CM | POA: Insufficient documentation

## 2016-02-15 DIAGNOSIS — F329 Major depressive disorder, single episode, unspecified: Secondary | ICD-10-CM | POA: Diagnosis not present

## 2016-02-15 DIAGNOSIS — J020 Streptococcal pharyngitis: Secondary | ICD-10-CM | POA: Insufficient documentation

## 2016-02-15 DIAGNOSIS — F17210 Nicotine dependence, cigarettes, uncomplicated: Secondary | ICD-10-CM | POA: Diagnosis not present

## 2016-02-15 DIAGNOSIS — J029 Acute pharyngitis, unspecified: Secondary | ICD-10-CM | POA: Diagnosis present

## 2016-02-15 NOTE — ED Notes (Addendum)
Pt c/o sore throat and fever x 3 days. Pt was given liter bolus and 1000mg  of tylenol by ems. Pt states she has not urinated all day.

## 2016-02-15 NOTE — ED Notes (Signed)
Fever sore throat for several days. Pt is [facility] by [facility] but has not seen

## 2016-02-15 NOTE — ED Notes (Signed)
Throat swab collected

## 2016-02-16 LAB — CBC WITH DIFFERENTIAL/PLATELET
BASOS ABS: 0 10*3/uL (ref 0.0–0.1)
BASOS PCT: 0 %
Eosinophils Absolute: 0 10*3/uL (ref 0.0–0.7)
Eosinophils Relative: 0 %
HEMATOCRIT: 31.9 % — AB (ref 36.0–46.0)
HEMOGLOBIN: 11 g/dL — AB (ref 12.0–15.0)
Lymphocytes Relative: 6 %
Lymphs Abs: 0.8 10*3/uL (ref 0.7–4.0)
MCH: 30.9 pg (ref 26.0–34.0)
MCHC: 34.5 g/dL (ref 30.0–36.0)
MCV: 89.6 fL (ref 78.0–100.0)
MONOS PCT: 6 %
Monocytes Absolute: 0.7 10*3/uL (ref 0.1–1.0)
NEUTROS PCT: 88 %
Neutro Abs: 11.1 10*3/uL — ABNORMAL HIGH (ref 1.7–7.7)
Platelets: 174 10*3/uL (ref 150–400)
RBC: 3.56 MIL/uL — ABNORMAL LOW (ref 3.87–5.11)
RDW: 11.8 % (ref 11.5–15.5)
WBC: 12.6 10*3/uL — AB (ref 4.0–10.5)

## 2016-02-16 LAB — URINALYSIS, ROUTINE W REFLEX MICROSCOPIC
Glucose, UA: NEGATIVE mg/dL
Hgb urine dipstick: NEGATIVE
LEUKOCYTES UA: NEGATIVE
NITRITE: NEGATIVE
PROTEIN: NEGATIVE mg/dL
Specific Gravity, Urine: 1.025 (ref 1.005–1.030)
pH: 5.5 (ref 5.0–8.0)

## 2016-02-16 LAB — COMPREHENSIVE METABOLIC PANEL
ALBUMIN: 3.7 g/dL (ref 3.5–5.0)
ALK PHOS: 52 U/L (ref 38–126)
ALT: 9 U/L — AB (ref 14–54)
AST: 12 U/L — AB (ref 15–41)
Anion gap: 8 (ref 5–15)
BILIRUBIN TOTAL: 0.8 mg/dL (ref 0.3–1.2)
BUN: 9 mg/dL (ref 6–20)
CALCIUM: 8 mg/dL — AB (ref 8.9–10.3)
CO2: 19 mmol/L — ABNORMAL LOW (ref 22–32)
Chloride: 108 mmol/L (ref 101–111)
Creatinine, Ser: 0.6 mg/dL (ref 0.44–1.00)
GFR calc Af Amer: >60 mL/min (ref >60)
GFR calc non Af Amer: >60 mL/min (ref >60)
GLUCOSE: 80 mg/dL (ref 65–99)
Potassium: 2.7 mmol/L — CL (ref 3.5–5.1)
Sodium: 135 mmol/L (ref 135–145)
TOTAL PROTEIN: 6.4 g/dL — AB (ref 6.5–8.1)

## 2016-02-16 LAB — PREGNANCY, URINE: PREG TEST UR: NEGATIVE

## 2016-02-16 LAB — RAPID STREP SCREEN (MED CTR MEBANE ONLY): STREPTOCOCCUS, GROUP A SCREEN (DIRECT): POSITIVE — AB

## 2016-02-16 MED ORDER — POTASSIUM CHLORIDE 20 MEQ/15ML (10%) PO SOLN
40.0000 meq | Freq: Once | ORAL | Status: AC
  Administered 2016-02-16: 40 meq via ORAL
  Filled 2016-02-16: qty 30

## 2016-02-16 MED ORDER — PENICILLIN G BENZATHINE 1200000 UNIT/2ML IM SUSP
1.2000 10*6.[IU] | Freq: Once | INTRAMUSCULAR | Status: AC
  Administered 2016-02-16: 1.2 10*6.[IU] via INTRAMUSCULAR
  Filled 2016-02-16: qty 2

## 2016-02-16 MED ORDER — AMOXICILLIN 500 MG PO CAPS: 500.0000 mg | ORAL_CAPSULE | Freq: Three times a day (TID) | ORAL | Status: DC

## 2016-02-16 MED ORDER — SODIUM CHLORIDE 0.9 % IV SOLN: Freq: Once | INTRAVENOUS | Status: DC

## 2016-02-16 NOTE — ED Notes (Signed)
Pt complains of tem being too high in room- thermostat adjusted- awaiting lab draw/results.  Pt is aware that we need a urine specimen from her

## 2016-02-16 NOTE — ED Provider Notes (Signed)
CSN: [id number]     Arrival date & time 02/15/16  2321 History   First MD Initiated Contact with Patient 02/15/16 2339     Chief Complaint  Patient presents with  . Fever  . Sore Throat     (Consider location/radiation/quality/duration/timing/severity/associated sxs/prior Treatment) Patient is a [age] y.o. female presenting with fever and pharyngitis. The history is provided by the patient. No language interpreter was used.  Fever Max temp prior to arrival:  100 Temp source:  Oral Severity:  Moderate Onset quality:  Gradual Duration:  2 days Timing:  Constant Progression:  Worsening Chronicity:  New Relieved by:  Nothing Worsened by:  Nothing tried Ineffective treatments:  None tried Associated symptoms: no congestion and no cough   Sore Throat Associated symptoms include a fever. Pertinent negatives include no congestion or coughing.    Past Medical History  Diagnosis Date  . Hx of gonorrhea   . Hx of chlamydia infection   . Hx of syphilis   . HPV (human papilloma virus) infection   . Abnormal Pap smear   . Anxiety   . Depression   . Headache(784.0)   . Hemorrhoids   . Constipation   . Pregnant 05/27/2013  . Diabetes mellitus without complication (HCC)   . Degenerative disc disease, cervical    Past Surgical History  Procedure Laterality Date  . Colposcopy  2009  . No past surgeries     Family History  Problem Relation Age of Onset  . Alcohol abuse Father   . Asthma Maternal Grandmother   . Diabetes Maternal Grandmother   . Hypertension Maternal Grandmother    Social History  Substance Use Topics  . Smoking status: Current Every Day Smoker -- 0.50 packs/day for 10 years    Types: Cigarettes  . Smokeless tobacco: Never Used  . Alcohol Use: No     Comment: Occ; not now   OB History    Gravida Para Term Preterm AB TAB SAB Ectopic Multiple Living   6 5 5  1 1    5      Review of Systems  Constitutional: Positive for fever.  HENT: Negative for congestion.    Respiratory: Negative for cough.   All other systems reviewed and are negative.     Allergies  Dimetapp cold-allergy; Hydrocodone; Robitussin (alcohol free); and Tramadol  Home Medications   Prior to Admission medications   Medication Sig Start Date End Date Taking? Authorizing Provider  ALPRAZolam Prudy Feeler(XANAX) 0.5 MG tablet Take 0.5 mg by mouth 3 (three) times daily as needed for anxiety.    Historical Provider, MD  Aspirin-Acetaminophen-Caffeine (GOODY HEADACHE PO) Take 1 Package by mouth daily as needed (pain).    Historical Provider, MD  carisoprodol (SOMA) 350 MG tablet Take 350 mg by mouth at bedtime.     Historical Provider, MD  cephALEXin (KEFLEX) 500 MG capsule Take 1 capsule (500 mg total) by mouth 4 (four) times daily. Patient not taking: Reported on 08/12/2015 07/12/14   Janne NapoleonHope M Neese, NP  diclofenac sodium (VOLTAREN) 1 % GEL Apply 2 g topically 4 (four) times daily.    Historical Provider, MD  DULoxetine (CYMBALTA) 60 MG capsule Take 60 mg by mouth 2 (two) times daily.    Historical Provider, MD  metroNIDAZOLE (METROGEL VAGINAL) 0.75 % vaginal gel Place 1 Applicatorful vaginally 2 (two) times daily. Patient not taking: Reported on 08/12/2015 07/12/14   Janne NapoleonHope M Neese, NP  omeprazole (PRILOSEC) 20 MG capsule Take 1 po BID x 2 weeks  then once a day 08/13/15   Devoria Albe, MD  ondansetron (ZOFRAN ODT) 4 MG disintegrating tablet Take 1 tablet (4 mg total) by mouth every 8 (eight) hours as needed for nausea or vomiting. Patient not taking: Reported on 08/12/2015 07/12/14   Janne Napoleon, NP  ondansetron (ZOFRAN) 4 MG tablet Take 1 tablet (4 mg total) by mouth every 8 (eight) hours as needed for nausea or vomiting. 08/13/15   Devoria Albe, MD  Oxycodone HCl 10 MG TABS Take 10 mg by mouth 5 (five) times daily.     Historical Provider, MD  promethazine (PHENERGAN) 25 MG suppository Place 0.5 suppositories (12.5 mg total) rectally every 6 (six) hours as needed for nausea or vomiting. Patient not  taking: Reported on 08/12/2015 07/12/14   Janne Napoleon, NP  tiZANidine (ZANAFLEX) 4 MG tablet Take 8 mg by mouth at bedtime as needed for muscle spasms.    Historical Provider, MD   BP 116/63 mmHg  Pulse 98  Temp(Src) 99.9 F (37.7 C) (Oral)  Resp 17  Ht  (1.651 m)  Wt 54.432 kg  BMI 19.97 kg/m2  SpO2 98%  LMP 01/23/2016 Physical Exam  Constitutional: She is oriented to person, place, and time. She appears well-developed and well-nourished.  HENT:  Head: Normocephalic and atraumatic.  Right Ear: External ear normal.  Mouth/Throat: Oropharynx is clear and moist.  Eyes: Conjunctivae and EOM are normal. Pupils are equal, round, and reactive to light.  Neck: Normal range of motion.  Cardiovascular: Normal rate.   Pulmonary/Chest: Effort normal.  Abdominal: Soft. She exhibits no distension.  Musculoskeletal: Normal range of motion.  Neurological: She is alert and oriented to person, place, and time.  Skin: Skin is warm.  Psychiatric: She has a normal mood and affect.  Nursing note and vitals reviewed.   ED Course  Procedures (including critical care time) Labs Review Labs Reviewed  RAPID STREP SCREEN (NOT AT [facility]) - Abnormal; Notable for the following:    Streptococcus, Group A Screen (Direct) POSITIVE (*)    All other components within normal limits  CBC WITH DIFFERENTIAL/PLATELET - Abnormal; Notable for the following:    WBC 12.6 (*)    RBC 3.56 (*)    Hemoglobin 11.0 (*)    HCT 31.9 (*)    Neutro Abs 11.1 (*)    All other components within normal limits  URINALYSIS, ROUTINE W REFLEX MICROSCOPIC (NOT AT [facility])  PREGNANCY, URINE  COMPREHENSIVE METABOLIC PANEL    Imaging Review No results found. I have personally reviewed and evaluated these images and lab results as part of my medical decision-making.   EKG Interpretation None      MDM   Final diagnoses:  Strep pharyngitis    Pt would perfer injection to pills.   Bicillian la 1.2 K 4o meq here.  Pt  given Iv ns x 1 liter Pt is on pain medication at home.  Pt advised to continue pain medication as prescribed by her pain mangement clinic.   Elson Areas, PA-C 02/16/16 0102  Elson Areas, PA-C 02/16/16 0109  Bethann Berkshire, MD 02/18/16 [phone]

## 2016-02-16 NOTE — ED Notes (Signed)
meds as ordered. Pt currently on phone trying to find a ride home

## 2016-02-16 NOTE — ED Notes (Signed)
teachback for DC instructions. Pt verbalizes understanding of DC instruct, and follow up

## 2016-02-16 NOTE — Discharge Instructions (Signed)

## 2016-02-16 NOTE — ED Notes (Signed)
Potassium level reported to L [place], [place]

## 2016-02-16 NOTE — ED Notes (Signed)
No reaction noted to antibiotics-

## 2016-10-27 ENCOUNTER — Telehealth: Payer: Self-pay | Admitting: General Practice

## 2016-10-27 NOTE — Telephone Encounter (Signed)
OK tamiflu 75mg  BID x 5 days

## 2016-10-27 NOTE — Telephone Encounter (Signed)
Please review and advise.

## 2016-10-28 ENCOUNTER — Ambulatory Visit: Payer: Medicaid Other | Admitting: Family Medicine

## 2016-10-28 MED ORDER — OSELTAMIVIR PHOSPHATE 75 MG PO CAPS: 75.0000 mg | ORAL_CAPSULE | Freq: Two times a day (BID) | ORAL | 0 refills | Status: DC

## 2016-10-28 NOTE — Telephone Encounter (Signed)
rx sent over and pt aware. 

## 2016-11-16 ENCOUNTER — Ambulatory Visit: Payer: Medicaid Other | Admitting: Pediatrics

## 2016-11-17 ENCOUNTER — Encounter: Payer: Self-pay | Admitting: Pediatrics

## 2017-04-07 IMAGING — DX DG LUMBAR SPINE 2-3V
3 series · 3 of 3 positions shown · non-contrast
Comparison: None.

CLINICAL DATA: Low back pain, left lower extremity pain for about 4
months, no known injury

EXAM:
LUMBAR SPINE - 2-3 VIEW

[l-spine ap]
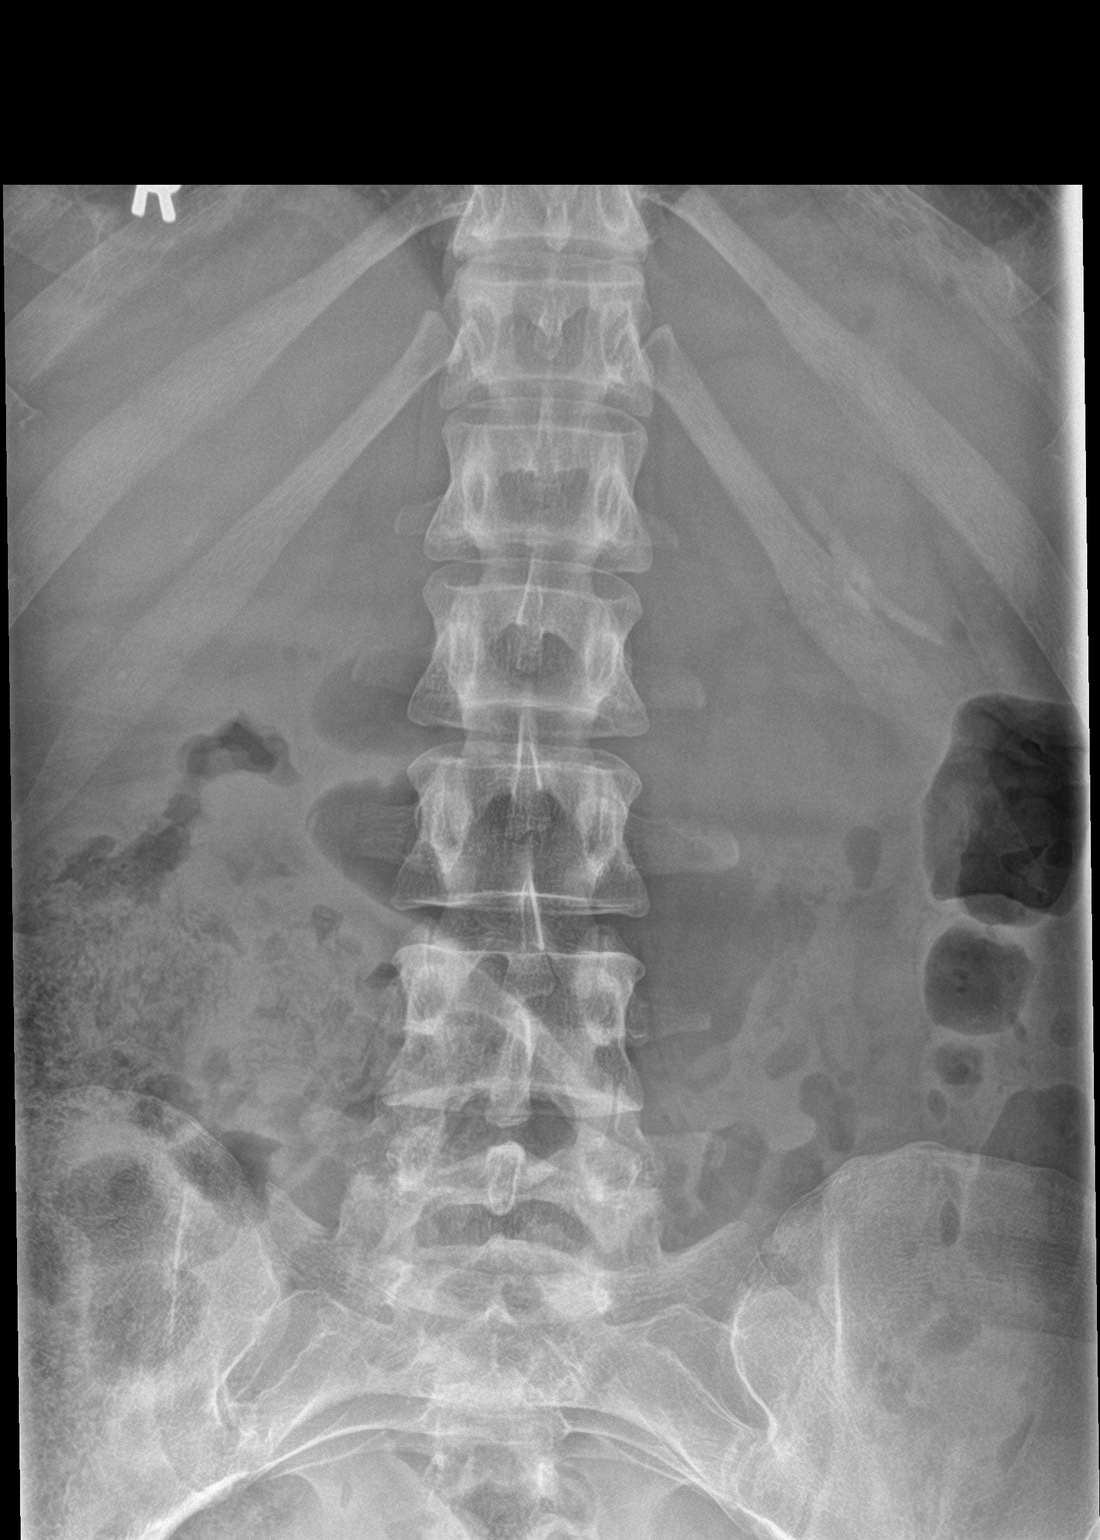

[l-spine lat]
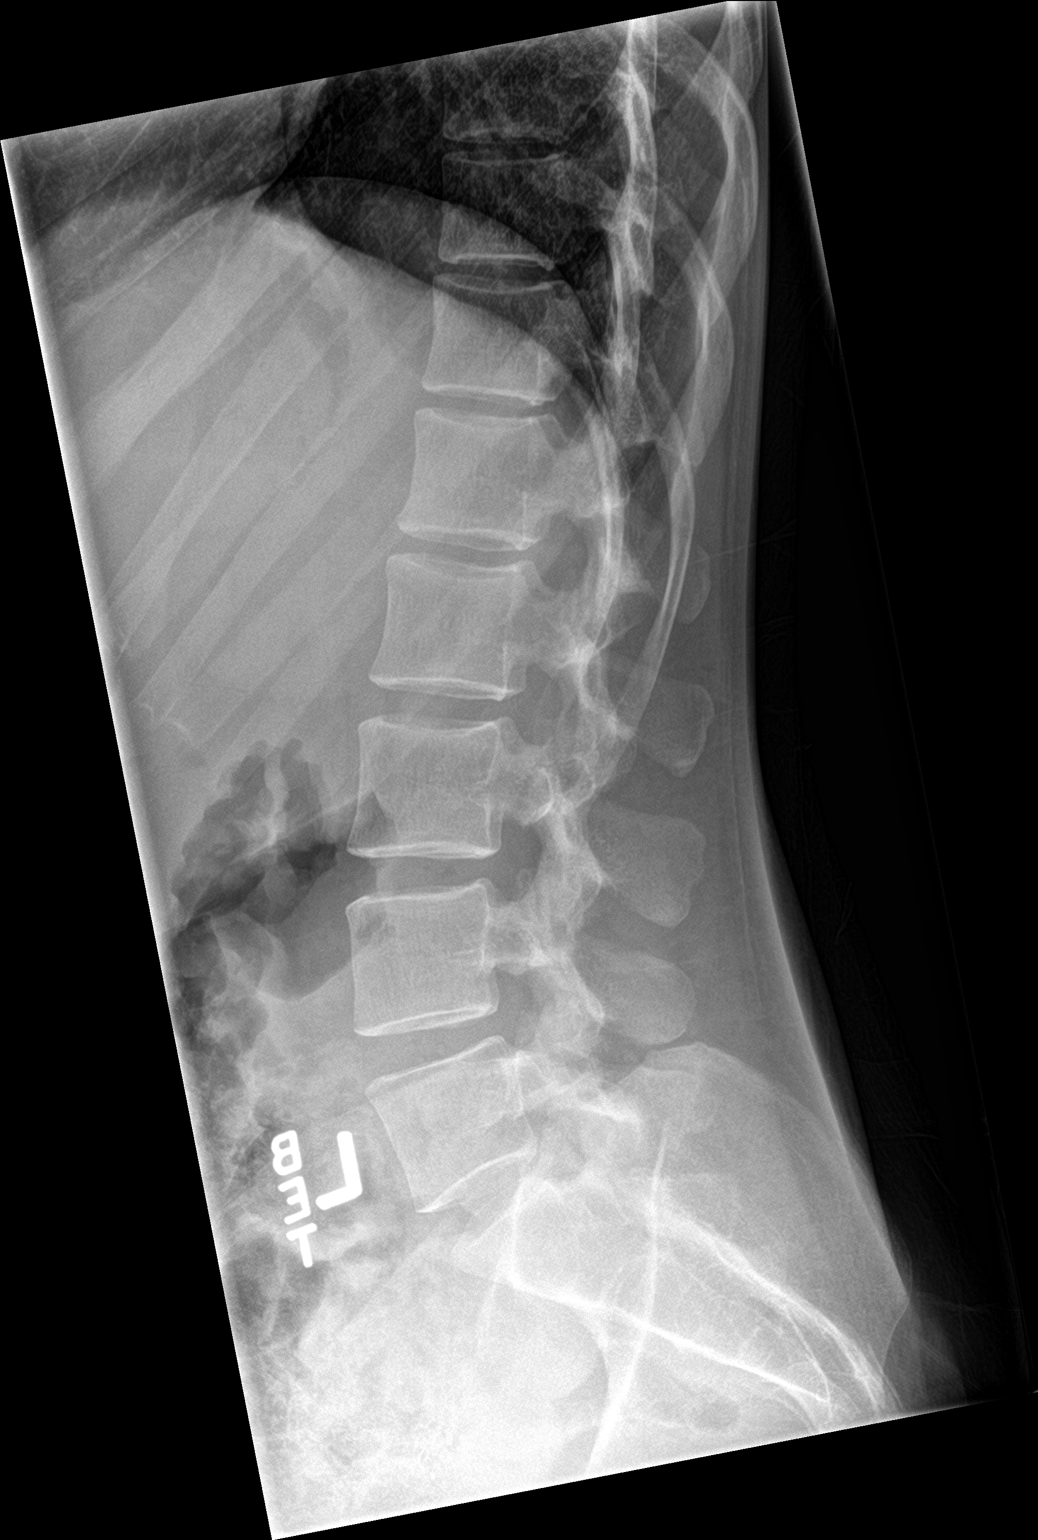

[l-spine spot]
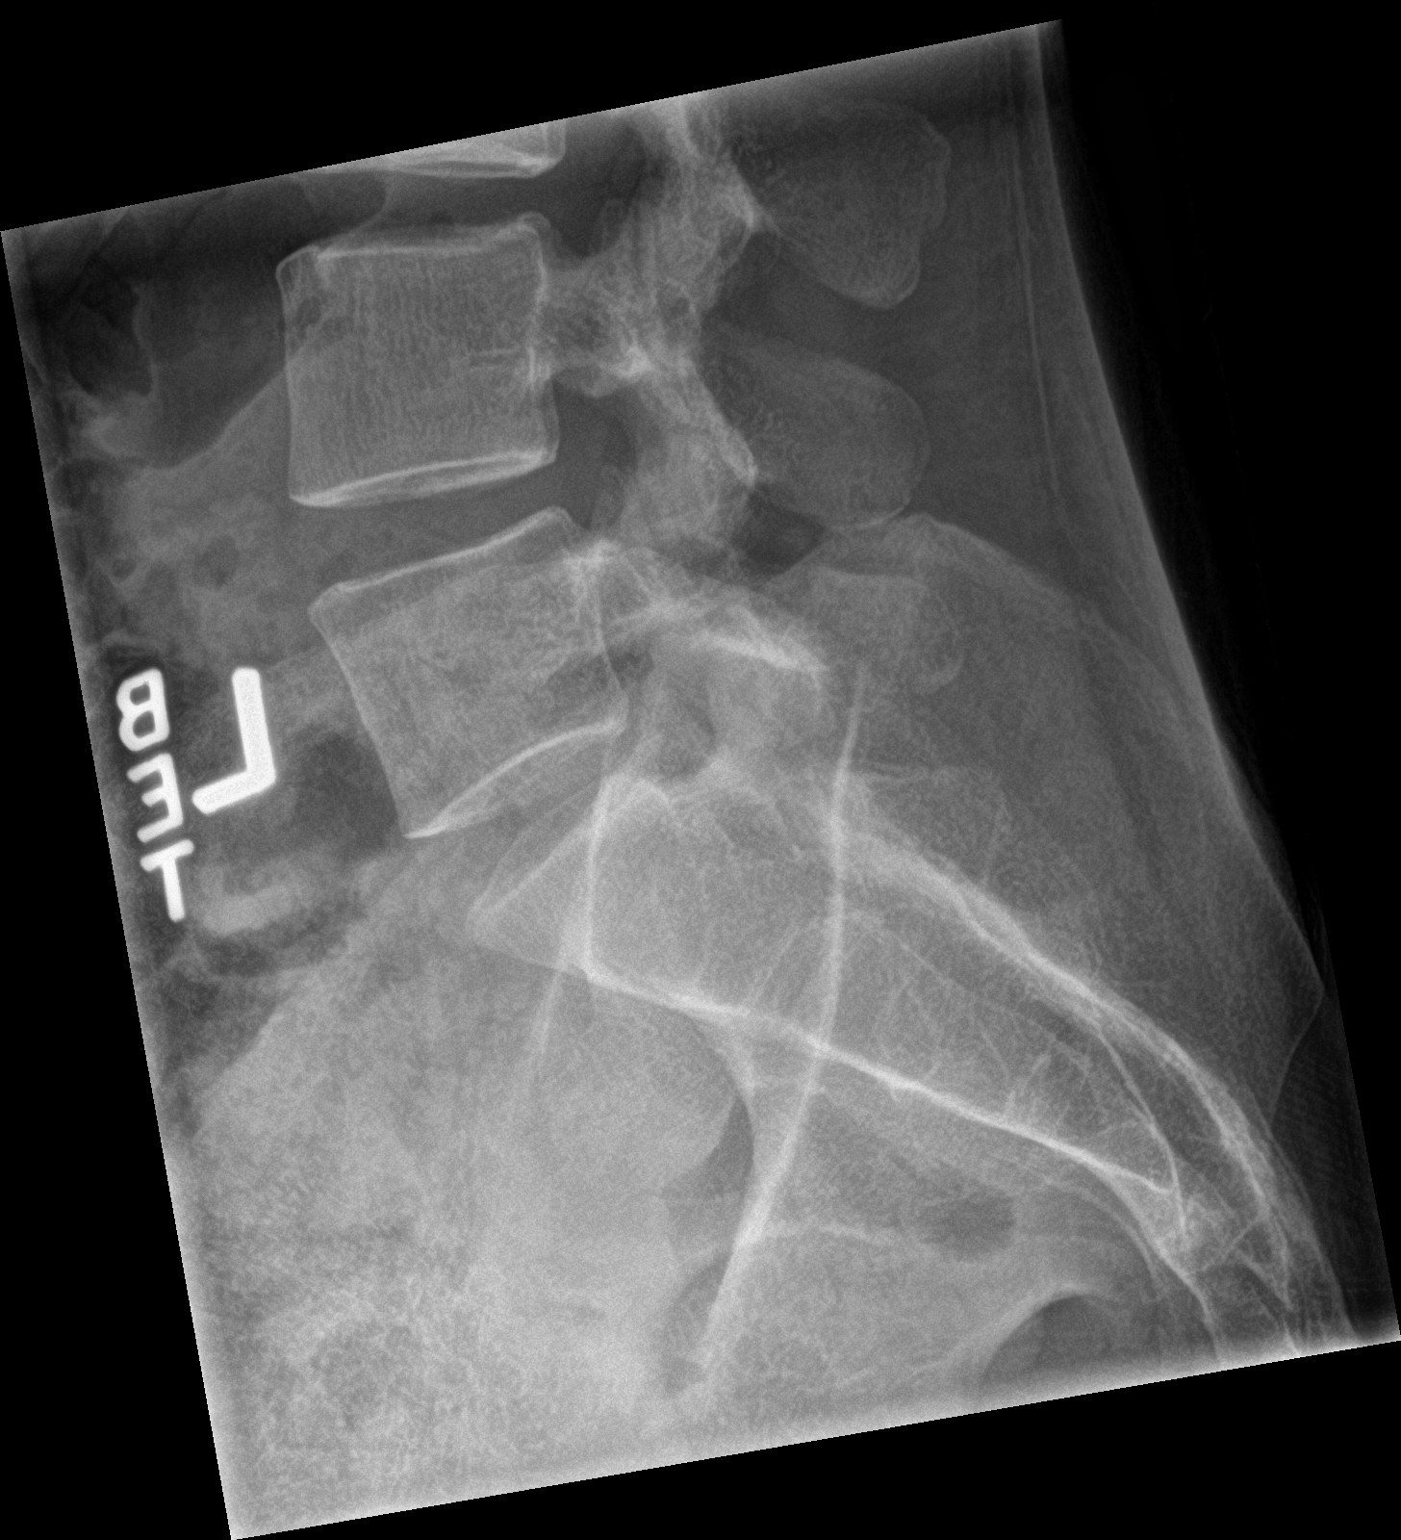

[3 of 3 positions shown; findings below may reference images not displayed]

FINDINGS: Three views of the lumbar spine submitted. No acute fracture or
subluxation. Mild disc space flattening at L5-S1 level. Alignment
and vertebral body heights are preserved.
IMPRESSION: No acute fracture or subluxation. Mild disc space flattening at
L5-S1 level.

## 2017-07-08 ENCOUNTER — Emergency Department (HOSPITAL_COMMUNITY): Payer: Medicaid Other

## 2017-07-08 ENCOUNTER — Emergency Department (HOSPITAL_COMMUNITY)
Admission: EM | Admit: 2017-07-08 | Discharge: 2017-07-08 | Disposition: A | Discharge status: Home / Self Care | Payer: Medicaid Other | Attending: Emergency Medicine | Admitting: Emergency Medicine

## 2017-07-08 ENCOUNTER — Encounter (HOSPITAL_COMMUNITY): Payer: Self-pay | Admitting: Emergency Medicine

## 2017-07-08 DIAGNOSIS — R52 Pain, unspecified: Secondary | ICD-10-CM

## 2017-07-08 DIAGNOSIS — Z79899 Other long term (current) drug therapy: Secondary | ICD-10-CM | POA: Insufficient documentation

## 2017-07-08 DIAGNOSIS — M25562 Pain in left knee: Secondary | ICD-10-CM | POA: Diagnosis not present

## 2017-07-08 DIAGNOSIS — M25462 Effusion, left knee: Secondary | ICD-10-CM

## 2017-07-08 DIAGNOSIS — F17210 Nicotine dependence, cigarettes, uncomplicated: Secondary | ICD-10-CM | POA: Diagnosis not present

## 2017-07-08 DIAGNOSIS — E119 Type 2 diabetes mellitus without complications: Secondary | ICD-10-CM | POA: Diagnosis not present

## 2017-07-08 MED ORDER — PREDNISONE 10 MG PO TABS: 20.0000 mg | ORAL_TABLET | Freq: Two times a day (BID) | ORAL | 0 refills | Status: DC

## 2017-07-08 NOTE — ED Provider Notes (Signed)
[facility] EMERGENCY DEPARTMENT Provider Note   CSN: [id number] Arrival date & time: 07/08/17  0209     History   Chief Complaint Chief Complaint  Patient presents with  . Knee Pain    HPI Lynn Spencer is a 31 y.o. female.  Patient is a 31 year old female presenting with complaints of left knee pain and swelling.  This is been worsening over the past 2 months.  It began in the absence of any injury or trauma.  She does report that she has a history of arthritis.  She denies any fevers or chills.   The history is provided by the patient.  Knee Pain   This is a new problem. Episode onset: 2 months ago. The problem occurs constantly. The problem has been gradually worsening. The pain is present in the left knee. The pain is moderate. Pertinent negatives include no numbness and full range of motion. The symptoms are aggravated by activity. She has tried nothing for the symptoms. The treatment provided no relief.    Past Medical History:  Diagnosis Date  . Abnormal Pap smear   . Anxiety   . Constipation   . Degenerative disc disease, cervical   . Depression   . Diabetes mellitus without complication (HCC)   . Headache(784.0)   . Hemorrhoids   . HPV (human papilloma virus) infection   . Hx of chlamydia infection   . Hx of gonorrhea   . Hx of syphilis   . Pregnant 05/27/2013    Patient Active Problem List   Diagnosis Date Noted  . Chronic back pain 06/24/2013  . Constipation 05/27/2013  . Hemorrhoids 05/27/2013  . Depression 05/27/2013  . Drug use 05/16/2013    Past Surgical History:  Procedure Laterality Date  . COLPOSCOPY  2009  . NO PAST SURGERIES      OB History    Gravida Para Term Preterm AB Living   6 5 5   1 5    SAB TAB Ectopic Multiple Live Births     1     5       Home Medications    Prior to Admission medications   Medication Sig Start Date End Date Taking? Authorizing Provider  ALPRAZolam Prudy Feeler(XANAX) 0.5 MG tablet Take 0.5 mg by mouth 3  (three) times daily as needed for anxiety.   Yes [provider]  diclofenac sodium (VOLTAREN) 1 % GEL Apply 2 g topically 4 (four) times daily.   Yes [provider]  Oxycodone HCl 10 MG TABS Take 10 mg by mouth 5 (five) times daily.    Yes [provider]  tiZANidine (ZANAFLEX) 4 MG tablet Take 8 mg by mouth at bedtime as needed for muscle spasms.   Yes [provider]  Aspirin-Acetaminophen-Caffeine (GOODY HEADACHE PO) Take 1 Package by mouth daily as needed (pain).    [provider]  carisoprodol (SOMA) 350 MG tablet Take 350 mg by mouth at bedtime.     [provider]  cephALEXin (KEFLEX) 500 MG capsule Take 1 capsule (500 mg total) by mouth 4 (four) times daily. Patient not taking: Reported on 08/12/2015 07/12/14   Janne NapoleonNeese, Hope M, NP  DULoxetine (CYMBALTA) 60 MG capsule Take 60 mg by mouth 2 (two) times daily.    [provider]  metroNIDAZOLE (METROGEL VAGINAL) 0.75 % vaginal gel Place 1 Applicatorful vaginally 2 (two) times daily. Patient not taking: Reported on 08/12/2015 07/12/14   Janne NapoleonNeese, Hope M, NP  omeprazole (PRILOSEC) 20 MG  capsule Take 1 po BID x 2 weeks then once a day 08/13/15   Devoria Albe, MD  ondansetron (ZOFRAN ODT) 4 MG disintegrating tablet Take 1 tablet (4 mg total) by mouth every 8 (eight) hours as needed for nausea or vomiting. Patient not taking: Reported on 08/12/2015 07/12/14   Janne Napoleon, NP  ondansetron (ZOFRAN) 4 MG tablet Take 1 tablet (4 mg total) by mouth every 8 (eight) hours as needed for nausea or vomiting. 08/13/15   Devoria Albe, MD  oseltamivir (TAMIFLU) 75 MG capsule Take 1 capsule (75 mg total) by mouth 2 (two) times daily. 10/28/16   Johna Sheriff, MD  promethazine (PHENERGAN) 25 MG suppository Place 0.5 suppositories (12.5 mg total) rectally every 6 (six) hours as needed for nausea or vomiting. Patient not taking: Reported on 08/12/2015 07/12/14   Janne Napoleon, NP    Family History Family  History  Problem Relation Age of Onset  . Alcohol abuse Father   . Asthma Maternal Grandmother   . Diabetes Maternal Grandmother   . Hypertension Maternal Grandmother     Social History Social History  Substance Use Topics  . Smoking status: Current Every Day Smoker    Packs/day: 0.50    Years: 10.00    Types: Cigarettes  . Smokeless tobacco: Never Used  . Alcohol use No     Comment: Occ; not now     Allergies   Dimetapp cold-allergy [brompheniramine-phenylephrine]; Hydrocodone; Robitussin (alcohol free) [guaifenesin]; and Tramadol   Review of Systems Review of Systems  Neurological: Negative for numbness.  All other systems reviewed and are negative.    Physical Exam Updated Vital Signs BP 120/89 (BP Location: Left Arm)   Pulse 96   Temp 98.5 F (36.9 C) (Oral)   Resp 18   Ht 5\' 6"  (1.676 m)   Wt 58.1 kg (128 lb)   LMP 06/26/2017 (Approximate)   SpO2 98%   BMI 20.66 kg/m   Physical Exam  Constitutional: She is oriented to person, place, and time. She appears well-developed and well-nourished. No distress.  HENT:  Head: Normocephalic and atraumatic.  Neck: Normal range of motion. Neck supple.  Musculoskeletal:  The left knee is noted to have a moderate-sized effusion.  There is no warmth or redness.  She has good range of motion with no limitation.  Anterior and posterior drawer tests are negative and there is no laxity with varus or valgus stress.  Neurological: She is alert and oriented to person, place, and time.  Skin: Skin is warm and dry. She is not diaphoretic.  Nursing note and vitals reviewed.    ED Treatments / Results  Labs (all labs ordered are listed, but only abnormal results are displayed) Labs Reviewed - No data to display  EKG  EKG Interpretation None       Radiology No results found.  Procedures Procedures (including critical care time)  Medications Ordered in ED Medications - No data to display   Initial Impression /  Assessment and Plan / ED Course  I have reviewed the triage vital signs and the nursing notes.  Pertinent labs & imaging results that were available during my care of the patient were reviewed by me and considered in my medical decision making (see chart for details).  Patient with a nontraumatic left knee effusion.  I suspect some sort of arthritis/inflammatory process as this began in the absence of any injury or trauma.  She has minimal discomfort with range of motion and  ambulation.  There is no warmth or erythema and I highly doubt an infectious etiology.  She does report being diagnosed with an STD when she was 59, however has been in a committed relationship for the past 5 years.  I highly doubt a gonococcal arthritis.  She will be discharged with a knee sleeve, prednisone, and follow-up with orthopedics in the next 1-2 weeks.  Final Clinical Impressions(s) / ED Diagnoses   Final diagnoses:  Pain    New Prescriptions New Prescriptions   No medications on file     Geoffery Lyons, MD 07/08/17 [phone]

## 2017-07-08 NOTE — ED Triage Notes (Signed)
Pt C/O knee pain that started over a month ago. Pt denies any injury to the knee.

## 2017-07-08 NOTE — ED Notes (Signed)
Pt ambulatory to waiting room. Pt verbalized understanding of discharge instructions.   

## 2017-07-08 NOTE — Discharge Instructions (Signed)
Prednisone as prescribed.  Wear knee sleeve for compression.  Follow-up with Dr. Romeo AppleHarrison in the orthopedic clinic if not improving in the next week.  His contact information has been provided in this discharge summary for you to call and make these arrangements.

## 2018-01-28 ENCOUNTER — Emergency Department (HOSPITAL_COMMUNITY)
Admission: EM | Admit: 2018-01-28 | Discharge: 2018-01-28 | Disposition: A | Discharge status: Home / Self Care | Payer: Medicaid Other | Attending: Emergency Medicine | Admitting: Emergency Medicine

## 2018-01-28 ENCOUNTER — Other Ambulatory Visit: Payer: Self-pay

## 2018-01-28 ENCOUNTER — Encounter (HOSPITAL_COMMUNITY): Payer: Self-pay | Admitting: Emergency Medicine

## 2018-01-28 DIAGNOSIS — F329 Major depressive disorder, single episode, unspecified: Secondary | ICD-10-CM | POA: Insufficient documentation

## 2018-01-28 DIAGNOSIS — F688 Other specified disorders of adult personality and behavior: Secondary | ICD-10-CM | POA: Diagnosis present

## 2018-01-28 DIAGNOSIS — F1420 Cocaine dependence, uncomplicated: Secondary | ICD-10-CM | POA: Insufficient documentation

## 2018-01-28 DIAGNOSIS — F1290 Cannabis use, unspecified, uncomplicated: Secondary | ICD-10-CM | POA: Diagnosis not present

## 2018-01-28 DIAGNOSIS — F1120 Opioid dependence, uncomplicated: Secondary | ICD-10-CM | POA: Diagnosis not present

## 2018-01-28 DIAGNOSIS — Z046 Encounter for general psychiatric examination, requested by authority: Secondary | ICD-10-CM | POA: Diagnosis not present

## 2018-01-28 DIAGNOSIS — F1520 Other stimulant dependence, uncomplicated: Secondary | ICD-10-CM | POA: Diagnosis not present

## 2018-01-28 DIAGNOSIS — E119 Type 2 diabetes mellitus without complications: Secondary | ICD-10-CM | POA: Diagnosis not present

## 2018-01-28 DIAGNOSIS — F17210 Nicotine dependence, cigarettes, uncomplicated: Secondary | ICD-10-CM | POA: Insufficient documentation

## 2018-01-28 DIAGNOSIS — Z79899 Other long term (current) drug therapy: Secondary | ICD-10-CM | POA: Diagnosis not present

## 2018-01-28 DIAGNOSIS — F1910 Other psychoactive substance abuse, uncomplicated: Secondary | ICD-10-CM

## 2018-01-28 LAB — URINALYSIS, ROUTINE W REFLEX MICROSCOPIC
Bilirubin Urine: NEGATIVE
GLUCOSE, UA: NEGATIVE mg/dL
HGB URINE DIPSTICK: NEGATIVE
KETONES UR: NEGATIVE mg/dL
Leukocytes, UA: NEGATIVE
Nitrite: NEGATIVE
PROTEIN: NEGATIVE mg/dL
Specific Gravity, Urine: 1.001 — ABNORMAL LOW (ref 1.005–1.030)
pH: 7 (ref 5.0–8.0)

## 2018-01-28 LAB — CBC WITH DIFFERENTIAL/PLATELET
BASOS ABS: 0 10*3/uL (ref 0.0–0.1)
Basophils Relative: 0 %
EOS ABS: 0.6 10*3/uL (ref 0.0–0.7)
EOS PCT: 8 %
HCT: 34.1 % — ABNORMAL LOW (ref 36.0–46.0)
Hemoglobin: 11.4 g/dL — ABNORMAL LOW (ref 12.0–15.0)
Lymphocytes Relative: 48 %
Lymphs Abs: 3.6 10*3/uL (ref 0.7–4.0)
MCH: 30.4 pg (ref 26.0–34.0)
MCHC: 33.4 g/dL (ref 30.0–36.0)
MCV: 90.9 fL (ref 78.0–100.0)
MONO ABS: 0.6 10*3/uL (ref 0.1–1.0)
Monocytes Relative: 8 %
Neutro Abs: 2.6 10*3/uL (ref 1.7–7.7)
Neutrophils Relative %: 36 %
PLATELETS: 211 10*3/uL (ref 150–400)
RBC: 3.75 MIL/uL — AB (ref 3.87–5.11)
RDW: 12.4 % (ref 11.5–15.5)
WBC: 7.4 10*3/uL (ref 4.0–10.5)

## 2018-01-28 LAB — HEPATIC FUNCTION PANEL
ALT: 8 U/L — AB (ref 14–54)
AST: 15 U/L (ref 15–41)
Albumin: 3.3 g/dL — ABNORMAL LOW (ref 3.5–5.0)
Alkaline Phosphatase: 47 U/L (ref 38–126)
BILIRUBIN TOTAL: 0.4 mg/dL (ref 0.3–1.2)
Bilirubin, Direct: 0.1 mg/dL (ref 0.1–0.5)
Indirect Bilirubin: 0.3 mg/dL (ref 0.3–0.9)
Total Protein: 5.6 g/dL — ABNORMAL LOW (ref 6.5–8.1)

## 2018-01-28 LAB — BASIC METABOLIC PANEL
ANION GAP: 5 (ref 5–15)
BUN: 8 mg/dL (ref 6–20)
CALCIUM: 8.6 mg/dL — AB (ref 8.9–10.3)
CO2: 29 mmol/L (ref 22–32)
Chloride: 103 mmol/L (ref 101–111)
Creatinine, Ser: 0.62 mg/dL (ref 0.44–1.00)
GFR calc Af Amer: >60 mL/min (ref >60)
GLUCOSE: 86 mg/dL (ref 65–99)
POTASSIUM: 3.1 mmol/L — AB (ref 3.5–5.1)
SODIUM: 137 mmol/L (ref 135–145)

## 2018-01-28 LAB — ACETAMINOPHEN LEVEL

## 2018-01-28 LAB — SALICYLATE LEVEL

## 2018-01-28 LAB — RAPID URINE DRUG SCREEN, HOSP PERFORMED
AMPHETAMINES: NOT DETECTED
BARBITURATES: NOT DETECTED
BENZODIAZEPINES: POSITIVE — AB
COCAINE: POSITIVE — AB
Opiates: NOT DETECTED
Tetrahydrocannabinol: POSITIVE — AB

## 2018-01-28 LAB — ETHANOL: Alcohol, Ethyl (B): <10 mg/dL (ref <10)

## 2018-01-28 LAB — PREGNANCY, URINE: PREG TEST UR: NEGATIVE

## 2018-01-28 MED ORDER — POTASSIUM CHLORIDE CRYS ER 20 MEQ PO TBCR
40.0000 meq | EXTENDED_RELEASE_TABLET | Freq: Once | ORAL | Status: AC
  Administered 2018-01-28: 40 meq via ORAL
  Filled 2018-01-28: qty 2

## 2018-01-28 NOTE — ED Notes (Signed)
MD has completed paperwork to rescind IVC status.
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
## 2018-01-28 NOTE — ED Notes (Signed)
MD wants pt to speak with social work prior to d/c. Social worker available at 8am today.

## 2018-01-28 NOTE — Discharge Instructions (Signed)
Follow up with the substance abuse resources listed. Return to the ED if you develop new or worsening symptoms.

## 2018-01-28 NOTE — ED Notes (Signed)
Pt's mother is here to pick her up and pt does not want to stay to speak with social worker. Pt given community mental health resources list.

## 2018-03-30 DIAGNOSIS — F17210 Nicotine dependence, cigarettes, uncomplicated: Secondary | ICD-10-CM | POA: Insufficient documentation

## 2018-03-30 DIAGNOSIS — R300 Dysuria: Secondary | ICD-10-CM | POA: Diagnosis present

## 2018-03-30 DIAGNOSIS — N3001 Acute cystitis with hematuria: Secondary | ICD-10-CM | POA: Insufficient documentation

## 2018-03-30 DIAGNOSIS — Z79899 Other long term (current) drug therapy: Secondary | ICD-10-CM | POA: Diagnosis not present

## 2018-03-30 DIAGNOSIS — E119 Type 2 diabetes mellitus without complications: Secondary | ICD-10-CM | POA: Insufficient documentation

## 2018-03-31 ENCOUNTER — Other Ambulatory Visit: Payer: Self-pay

## 2018-03-31 ENCOUNTER — Encounter (HOSPITAL_COMMUNITY): Payer: Self-pay | Admitting: Emergency Medicine

## 2018-03-31 ENCOUNTER — Emergency Department (HOSPITAL_COMMUNITY)
Admission: EM | Admit: 2018-03-31 | Discharge: 2018-03-31 | Disposition: A | Discharge status: Home / Self Care | Payer: Medicaid Other | Attending: Emergency Medicine | Admitting: Emergency Medicine

## 2018-03-31 DIAGNOSIS — N3001 Acute cystitis with hematuria: Secondary | ICD-10-CM

## 2018-03-31 LAB — URINALYSIS, ROUTINE W REFLEX MICROSCOPIC
BACTERIA UA: NONE SEEN
BILIRUBIN URINE: NEGATIVE
Glucose, UA: NEGATIVE mg/dL
Ketones, ur: NEGATIVE mg/dL
Nitrite: NEGATIVE
Protein, ur: 30 mg/dL — AB
RBC / HPF: >50 RBC/hpf — ABNORMAL HIGH (ref 0–5)
Specific Gravity, Urine: 1.019 (ref 1.005–1.030)
pH: 5 (ref 5.0–8.0)

## 2018-03-31 LAB — PREGNANCY, URINE: PREG TEST UR: NEGATIVE

## 2018-03-31 MED ORDER — PHENAZOPYRIDINE HCL 200 MG PO TABS: 200.0000 mg | ORAL_TABLET | Freq: Three times a day (TID) | ORAL | 0 refills | Status: DC

## 2018-03-31 MED ORDER — CEPHALEXIN 500 MG PO CAPS
500.0000 mg | ORAL_CAPSULE | Freq: Once | ORAL | Status: AC
  Administered 2018-03-31: 500 mg via ORAL
  Filled 2018-03-31: qty 1

## 2018-03-31 MED ORDER — PHENAZOPYRIDINE HCL 100 MG PO TABS
200.0000 mg | ORAL_TABLET | Freq: Once | ORAL | Status: AC
  Administered 2018-03-31: 200 mg via ORAL
  Filled 2018-03-31: qty 2

## 2018-03-31 MED ORDER — CEPHALEXIN 500 MG PO CAPS: 500.0000 mg | ORAL_CAPSULE | Freq: Three times a day (TID) | ORAL | 0 refills | Status: DC

## 2018-03-31 NOTE — Discharge Instructions (Addendum)
Drink plenty of fluids. Take the antibiotic until gone. Take the pyridium for pain and frequency of urination. It will turn your urine orange and will stain your underwear if you leak, wear a pad and wear old underwear. Return to the ED if you get vomiting, get abdominal or flank pain, get a high fever or aren't improving over the next 48 hours.

## 2018-03-31 NOTE — ED Provider Notes (Signed)
[facility] EMERGENCY DEPARTMENT Provider Note   CSN: [id number] Arrival date & time: 03/30/18  2325  Time seen 01:10 AM   History   Chief Complaint Chief Complaint  Patient presents with  . Dysuria    HPI Lynn Spencer is a 32 y.o. female.  HPI patient states last week she started having dysuria, she started getting frequency a few days ago and states she is going about 3 times an hour and then today she saw some blood when she urinated.  She states she has some mild suprapubic pain like a menstrual cramp earlier today.  She denies nausea, vomiting, or fever.  She states she had UTIs when she was pregnant with her son who is now 4.  She was treated for a kidney infection after he was born once but she did not have to be hospitalized.  [facility]  Past Medical History:  Diagnosis Date  . Abnormal Pap smear   . Anxiety   . Constipation   . Degenerative disc disease, cervical   . Depression   . Diabetes mellitus without complication (HCC)   . Headache(784.0)   . Hemorrhoids   . HPV (human papilloma virus) infection   . Hx of chlamydia infection   . Hx of gonorrhea   . Hx of syphilis   . Pregnant 05/27/2013    Patient Active Problem List   Diagnosis Date Noted  . Chronic back pain 06/24/2013  . Constipation 05/27/2013  . Hemorrhoids 05/27/2013  . Depression 05/27/2013  . Drug use 05/16/2013    Past Surgical History:  Procedure Laterality Date  . COLPOSCOPY  2009  . NO PAST SURGERIES       OB History    Gravida  6   Para  5   Term  5   Preterm      AB  1   Living  5     SAB      TAB  1   Ectopic      Multiple      Live Births  5            Home Medications    Prior to Admission medications   Medication Sig Start Date End Date Taking? Authorizing Provider  ALPRAZolam Prudy Feeler(XANAX) 0.5 MG tablet Take 0.5 mg by mouth 3 (three) times daily as needed for anxiety.    [provider]  Aspirin-Acetaminophen-Caffeine  (GOODY HEADACHE PO) Take 1 Package by mouth daily as needed (pain).    [provider]  carisoprodol (SOMA) 350 MG tablet Take 350 mg by mouth at bedtime.     [provider]  cephALEXin (KEFLEX) 500 MG capsule Take 1 capsule (500 mg total) by mouth 3 (three) times daily. 03/31/18   Devoria AlbeKnapp, Dmitri Pettigrew, MD  diclofenac sodium (VOLTAREN) 1 % GEL Apply 2 g topically 4 (four) times daily.    [provider]  DULoxetine (CYMBALTA) 60 MG capsule Take 60 mg by mouth 2 (two) times daily.    [provider]  metroNIDAZOLE (METROGEL VAGINAL) 0.75 % vaginal gel Place 1 Applicatorful vaginally 2 (two) times daily. Patient not taking: Reported on 08/12/2015 07/12/14   Janne NapoleonNeese, Hope M, NP  omeprazole (PRILOSEC) 20 MG capsule Take 1 po BID x 2 weeks then once a day 08/13/15   Devoria AlbeKnapp, Robbyn Hodkinson, MD  ondansetron (ZOFRAN ODT) 4 MG disintegrating tablet Take 1 tablet (4 mg total) by mouth every 8 (eight) hours as needed for nausea or vomiting. Patient  not taking: Reported on 08/12/2015 07/12/14   Janne Napoleon, NP  ondansetron (ZOFRAN) 4 MG tablet Take 1 tablet (4 mg total) by mouth every 8 (eight) hours as needed for nausea or vomiting. 08/13/15   Devoria Albe, MD  oseltamivir (TAMIFLU) 75 MG capsule Take 1 capsule (75 mg total) by mouth 2 (two) times daily. 10/28/16   Johna Sheriff, MD  Oxycodone HCl 10 MG TABS Take 10 mg by mouth 5 (five) times daily.     [provider]  phenazopyridine (PYRIDIUM) 200 MG tablet Take 1 tablet (200 mg total) by mouth 3 (three) times daily. 03/31/18   Devoria Albe, MD  predniSONE (DELTASONE) 10 MG tablet Take 2 tablets (20 mg total) by mouth 2 (two) times daily. 07/08/17   Geoffery Lyons, MD  promethazine (PHENERGAN) 25 MG suppository Place 0.5 suppositories (12.5 mg total) rectally every 6 (six) hours as needed for nausea or vomiting. Patient not taking: Reported on 08/12/2015 07/12/14   Janne Napoleon, NP  tiZANidine (ZANAFLEX) 4 MG tablet Take 8 mg by mouth at  bedtime as needed for muscle spasms.    [provider]    Family History Family History  Problem Relation Age of Onset  . Alcohol abuse Father   . Asthma Maternal Grandmother   . Diabetes Maternal Grandmother   . Hypertension Maternal Grandmother     Social History Social History   Tobacco Use  . Smoking status: Current Every Day Smoker    Packs/day: 0.50    Years: 10.00    Pack years: 5.00    Types: Cigarettes  . Smokeless tobacco: Never Used  Substance Use Topics  . Alcohol use: No    Comment: Occ; not now  . Drug use: Yes    Types: Marijuana    Comment: november 4  unemployed   Allergies   Dimetapp cold-allergy [brompheniramine-phenylephrine]; Hydrocodone; Robitussin (alcohol free) [guaifenesin]; and Tramadol   Review of Systems Review of Systems  All other systems reviewed and are negative.    Physical Exam Updated Vital Signs BP 127/86 (BP Location: Right Arm)   Pulse 61   Temp 99 F (37.2 C) (Oral)   Resp 18   Ht 5\' 5"  (1.651 m)   Wt 54.4 kg (120 lb)   LMP 03/12/2018   SpO2 100%   BMI 19.97 kg/m   Physical Exam  Constitutional: She is oriented to person, place, and time. She appears well-developed and well-nourished.  HENT:  Head: Normocephalic and atraumatic.  Right Ear: External ear normal.  Left Ear: External ear normal.  Nose: Nose normal.  Eyes: Conjunctivae and EOM are normal.  Neck: Normal range of motion.  Cardiovascular: Normal rate.  Pulmonary/Chest: Effort normal. No respiratory distress.  Abdominal: Soft. Bowel sounds are normal. She exhibits no distension and no mass. There is no tenderness. There is no guarding.  She has mild bilateral CVA tenderness to percussion  Musculoskeletal: Normal range of motion. She exhibits no deformity.  Neurological: She is alert and oriented to person, place, and time. No cranial nerve deficit.  Skin: Skin is warm and dry. Capillary refill takes less than 2 seconds. No erythema.    Psychiatric: She has a normal mood and affect. Her behavior is normal. Thought content normal.  Nursing note and vitals reviewed.    ED Treatments / Results  Labs (all labs ordered are listed, but only abnormal results are displayed) Results for orders placed or performed during the hospital encounter of 03/31/18  Urinalysis,  Routine w reflex microscopic  Result Value Ref Range   Color, Urine YELLOW YELLOW   APPearance CLOUDY (A) CLEAR   Specific Gravity, Urine 1.019 1.005 - 1.030   pH 5.0 5.0 - 8.0   Glucose, UA NEGATIVE NEGATIVE mg/dL   Hgb urine dipstick MODERATE (A) NEGATIVE   Bilirubin Urine NEGATIVE NEGATIVE   Ketones, ur NEGATIVE NEGATIVE mg/dL   Protein, ur 30 (A) NEGATIVE mg/dL   Nitrite NEGATIVE NEGATIVE   Leukocytes, UA LARGE (A) NEGATIVE   RBC / HPF >50 (H) 0 - 5 RBC/hpf   WBC, UA 21-50 0 - 5 WBC/hpf   Bacteria, UA NONE SEEN NONE SEEN   Squamous Epithelial / LPF 0-5 0 - 5   WBC Clumps PRESENT   Pregnancy, urine  Result Value Ref Range   Preg Test, Ur NEGATIVE NEGATIVE   Laboratory interpretation all normal except possible UTI    EKG None  Radiology No results found.  Procedures Procedures (including critical care time)  Culture Setup Time 07/13/2014 02:28  Performed at [facility] Count >=100,000 COLONIES/ML  Performed at [facility]     Culture ESCHERICHIA COLI  Performed at [facility]     Report Status 07/15/2014 FINAL   Organism ID, Bacteria ESCHERICHIA COLI   Resulting Agency [facility]  Susceptibility    Escherichia coli    MIC    AMPICILLIN <=2 SENSITIVE  Sensitive    CEFAZOLIN <=4 SENSITIVE  Sensitive    CEFTRIAXONE <=1 SENSITIVE  Sensitive    CIPROFLOXACIN <=0.25 SENS... Sensitive    GENTAMICIN <=1 SENSITIVE  Sensitive    LEVOFLOXACIN <=0.12 SENS... Sensitive    NITROFURANTOIN <=16 SENSIT... Sensitive    PIP/TAZO <=4 SENSITIVE  Sensitive    TOBRAMYCIN <=1 SENSITIVE  Sensitive     TRIMETH/SULFA <=20 SENSIT... Sensitive         Susceptibility Comments   Escherichia coli  ESCHERICHIA COLI      Specimen Collected: 07/12/14 08:10 Last Resulted: 07/15/14 07:46       Medications Ordered in ED Medications  cephALEXin (KEFLEX) capsule 500 mg (has no administration in time range)  phenazopyridine (PYRIDIUM) tablet 200 mg (has no administration in time range)     Initial Impression / Assessment and Plan / ED Course  I have reviewed the triage vital signs and the nursing notes.  Pertinent labs & imaging results that were available during my care of the patient were reviewed by me and considered in my medical decision making (see chart for details).    Although her urine was nitrite negative she did have white blood cell clumps which is very suggestive of UTI.  She was started on oral Keflex.  I reviewed her last urine culture which was in 2015 and she had a E. coli that was sensitive to all antibiotics.  She has some mild CVA tenderness bilaterally however she has no fever, nausea or vomiting.  She can be treated as an outpatient.   Final Clinical Impressions(s) / ED Diagnoses   Final diagnoses:  Acute cystitis with hematuria    ED Discharge Orders        Ordered    cephALEXin (KEFLEX) 500 MG capsule  3 times daily     03/31/18 0131    phenazopyridine (PYRIDIUM) 200 MG tablet  3 times daily     03/31/18 0131      Plan discharge  Devoria Albe, MD, Concha Pyo, MD  03/31/18 0133  

## 2018-03-31 NOTE — ED Triage Notes (Addendum)
Pt states " I have a UTI'. C/o pink in urine and burning with urination x 1 week. C/o chillls.

## 2018-04-02 LAB — URINE CULTURE: SPECIAL REQUESTS: NORMAL

## 2018-04-03 ENCOUNTER — Telehealth: Payer: Self-pay | Admitting: Emergency Medicine

## 2018-04-03 NOTE — Telephone Encounter (Signed)
Post ED Visit - Positive Culture Follow-up  Culture report reviewed by antimicrobial stewardship pharmacist:  []  Enzo BiNathan Batchelder, Pharm.D. []  Celedonio MiyamotoJeremy Frens, Pharm.D., BCPS AQ-ID []  Garvin FilaMike Maccia, Pharm.D., BCPS []  Georgina PillionElizabeth Martin, [address]Ph[address]D., BCPS []  [place]ico VillageMinh Pham, [address]Pharm[address]D., BCPS, AAHIVP []  Estella HuskMichelle Turner, Pharm.D., BCPS, AAHIVP [x]  Lysle Pearlachel Rumbarger, PharmD, BCPS []  Phillips Climeshuy Dang, PharmD, BCPS []  Agapito GamesAlison Masters, PharmD, BCPS []  Verlan FriendsErin Deja, PharmD  Positive urine culture Treated with cephalexin, organism sensitive to the same and no further patient follow-up is required at this time.  Berle MullMiller, Madigan Rosensteel 04/03/2018, 12:22 PM

## 2022-09-21 ENCOUNTER — Other Ambulatory Visit (HOSPITAL_COMMUNITY): Payer: Self-pay | Admitting: *Deleted

## 2022-09-21 DIAGNOSIS — E041 Nontoxic single thyroid nodule: Secondary | ICD-10-CM

## 2022-09-30 ENCOUNTER — Ambulatory Visit (HOSPITAL_COMMUNITY)
Admission: RE | Admit: 2022-09-30 | Discharge: 2022-09-30 | Disposition: A | Discharge status: Home / Self Care | Payer: Medicaid Other | Source: Ambulatory Visit | Attending: *Deleted | Admitting: *Deleted

## 2022-09-30 DIAGNOSIS — E041 Nontoxic single thyroid nodule: Secondary | ICD-10-CM

## 2023-09-24 ENCOUNTER — Other Ambulatory Visit: Payer: Self-pay

## 2023-09-24 ENCOUNTER — Encounter (HOSPITAL_COMMUNITY): Payer: Self-pay | Admitting: Emergency Medicine

## 2023-09-24 ENCOUNTER — Emergency Department (HOSPITAL_COMMUNITY)
Admission: EM | Admit: 2023-09-24 | Discharge: 2023-09-24 | Disposition: A | Discharge status: Home / Self Care | Payer: Medicaid Other | Attending: Emergency Medicine | Admitting: Emergency Medicine

## 2023-09-24 DIAGNOSIS — Z7982 Long term (current) use of aspirin: Secondary | ICD-10-CM | POA: Diagnosis not present

## 2023-09-24 DIAGNOSIS — Z20822 Contact with and (suspected) exposure to covid-19: Secondary | ICD-10-CM | POA: Insufficient documentation

## 2023-09-24 DIAGNOSIS — R059 Cough, unspecified: Secondary | ICD-10-CM | POA: Diagnosis present

## 2023-09-24 DIAGNOSIS — J101 Influenza due to other identified influenza virus with other respiratory manifestations: Secondary | ICD-10-CM | POA: Insufficient documentation

## 2023-09-24 LAB — COMPREHENSIVE METABOLIC PANEL
ALT: 10 U/L (ref 0–44)
AST: 14 U/L — ABNORMAL LOW (ref 15–41)
Albumin: 4 g/dL (ref 3.5–5.0)
Alkaline Phosphatase: 44 U/L (ref 38–126)
Anion gap: 7 (ref 5–15)
BUN: 10 mg/dL (ref 6–20)
CO2: 21 mmol/L — ABNORMAL LOW (ref 22–32)
Calcium: 8.8 mg/dL — ABNORMAL LOW (ref 8.9–10.3)
Chloride: 105 mmol/L (ref 98–111)
Creatinine, Ser: 0.7 mg/dL (ref 0.44–1.00)
GFR, Estimated: >60 mL/min (ref >60)
Glucose, Bld: 99 mg/dL (ref 70–99)
Potassium: 3.4 mmol/L — ABNORMAL LOW (ref 3.5–5.1)
Sodium: 133 mmol/L — ABNORMAL LOW (ref 135–145)
Total Bilirubin: 0.5 mg/dL (ref 0.0–1.2)
Total Protein: 7.1 g/dL (ref 6.5–8.1)

## 2023-09-24 LAB — URINALYSIS, ROUTINE W REFLEX MICROSCOPIC
Bacteria, UA: NONE SEEN
Bilirubin Urine: NEGATIVE
Glucose, UA: NEGATIVE mg/dL
Ketones, ur: 80 mg/dL — AB
Nitrite: NEGATIVE
Protein, ur: 30 mg/dL — AB
Specific Gravity, Urine: 1.028 (ref 1.005–1.030)
pH: 5 (ref 5.0–8.0)

## 2023-09-24 LAB — CBC
HCT: 38.2 % (ref 36.0–46.0)
Hemoglobin: 12.8 g/dL (ref 12.0–15.0)
MCH: 29.2 pg (ref 26.0–34.0)
MCHC: 33.5 g/dL (ref 30.0–36.0)
MCV: 87.2 fL (ref 80.0–100.0)
Platelets: 275 10*3/uL (ref 150–400)
RBC: 4.38 MIL/uL (ref 3.87–5.11)
RDW: 11.5 % (ref 11.5–15.5)
WBC: 2.9 10*3/uL — ABNORMAL LOW (ref 4.0–10.5)
nRBC: 0 % (ref 0.0–0.2)

## 2023-09-24 LAB — RESP PANEL BY RT-PCR (RSV, FLU A&B, COVID)  RVPGX2
Influenza A by PCR: POSITIVE — AB
Influenza B by PCR: NEGATIVE
Resp Syncytial Virus by PCR: NEGATIVE
SARS Coronavirus 2 by RT PCR: NEGATIVE

## 2023-09-24 LAB — LIPASE, BLOOD: Lipase: 28 U/L (ref 11–51)

## 2023-09-24 LAB — POC URINE PREG, ED: Preg Test, Ur: NEGATIVE

## 2023-09-24 MED ORDER — SODIUM CHLORIDE 0.9 % IV BOLUS
1000.0000 mL | Freq: Once | INTRAVENOUS | Status: AC
  Administered 2023-09-24: 1000 mL via INTRAVENOUS

## 2023-09-24 MED ORDER — ONDANSETRON HCL 4 MG/2ML IJ SOLN
4.0000 mg | Freq: Once | INTRAMUSCULAR | Status: AC
Start: 2023-09-24 — End: 2023-09-24
  Administered 2023-09-24: 4 mg via INTRAVENOUS
  Filled 2023-09-24: qty 2

## 2023-09-24 NOTE — ED Triage Notes (Signed)
 Pt with c/o emesis, chills, and weakness since this am. States she is unable to keep any fluids down.

## 2023-09-24 NOTE — ED Provider Notes (Signed)
 [place] EMERGENCY DEPARTMENT AT [facility]  Provider Note  CSN: [id number] Arrival date & time: 09/24/23 0010  History Chief Complaint  Patient presents with   Emesis    Lynn Spencer is a 38 y.o. female here with 1 day of flu-like symptoms with dry cough, nausea, vomiting, general weakness, myalgias and chills.    Home Medications Prior to Admission medications   Medication Sig Start Date End Date Taking? Authorizing Provider  ALPRAZolam (XANAX) 0.5 MG tablet Take 0.5 mg by mouth 3 (three) times daily as needed for anxiety.    [provider]  Aspirin-Acetaminophen -Caffeine (GOODY HEADACHE PO) Take 1 Package by mouth daily as needed (pain).    [provider]  carisoprodol (SOMA) 350 MG tablet Take 350 mg by mouth at bedtime.     [provider]  cephALEXin  (KEFLEX ) 500 MG capsule Take 1 capsule (500 mg total) by mouth 3 (three) times daily. 03/31/18   Knapp, Iva, MD  diclofenac sodium (VOLTAREN) 1 % GEL Apply 2 g topically 4 (four) times daily.    [provider]  DULoxetine (CYMBALTA) 60 MG capsule Take 60 mg by mouth 2 (two) times daily.    [provider]  metroNIDAZOLE  (METROGEL  VAGINAL) 0.75 % vaginal gel Place 1 Applicatorful vaginally 2 (two) times daily. Patient not taking: Reported on 08/12/2015 07/12/14   Jamelle Lorrayne CHRISTELLA, NP  omeprazole  (PRILOSEC) 20 MG capsule Take 1 po BID x 2 weeks then once a day 08/13/15   Knapp, Iva, MD  ondansetron  (ZOFRAN  ODT) 4 MG disintegrating tablet Take 1 tablet (4 mg total) by mouth every 8 (eight) hours as needed for nausea or vomiting. Patient not taking: Reported on 08/12/2015 07/12/14   Jamelle Lorrayne CHRISTELLA, NP  ondansetron  (ZOFRAN ) 4 MG tablet Take 1 tablet (4 mg total) by mouth every 8 (eight) hours as needed for nausea or vomiting. 08/13/15   Knapp, Iva, MD  oseltamivir  (TAMIFLU ) 75 MG capsule Take 1 capsule (75 mg total) by mouth 2 (two) times daily. 10/28/16   Jerrell Niels CROME, MD   Oxycodone  HCl 10 MG TABS Take 10 mg by mouth 5 (five) times daily.     [provider]  phenazopyridine  (PYRIDIUM ) 200 MG tablet Take 1 tablet (200 mg total) by mouth 3 (three) times daily. 03/31/18   Knapp, Iva, MD  predniSONE  (DELTASONE ) 10 MG tablet Take 2 tablets (20 mg total) by mouth 2 (two) times daily. 07/08/17   Geroldine Berg, MD  promethazine  (PHENERGAN ) 25 MG suppository Place 0.5 suppositories (12.5 mg total) rectally every 6 (six) hours as needed for nausea or vomiting. Patient not taking: Reported on 08/12/2015 07/12/14   Jamelle Lorrayne CHRISTELLA, NP  tiZANidine (ZANAFLEX) 4 MG tablet Take 8 mg by mouth at bedtime as needed for muscle spasms.    [provider]     Allergies    Dimetapp cold-allergy [brompheniramine-phenylephrine ], Hydrocodone, Robitussin (alcohol free) [guaifenesin], and Tramadol   Review of Systems   Review of Systems Please see HPI for pertinent positives and negatives  Physical Exam BP 118/73   Pulse 79   Temp 100 F (37.8 C) (Oral)   Resp 20   Ht 5' 5 (1.651 m)   Wt 70.3 kg   LMP 09/23/2023 (Exact Date)   SpO2 96%   BMI 25.79 kg/m   Physical Exam Vitals and nursing note reviewed.  Constitutional:      Appearance: Normal appearance.  HENT:     Head: Normocephalic and  atraumatic.     Nose: Nose normal.     Mouth/Throat:     Mouth: Mucous membranes are dry.  Eyes:     Extraocular Movements: Extraocular movements intact.     Conjunctiva/sclera: Conjunctivae normal.  Cardiovascular:     Rate and Rhythm: Normal rate.  Pulmonary:     Effort: Pulmonary effort is normal.     Breath sounds: Normal breath sounds.  Abdominal:     General: Abdomen is flat.     Palpations: Abdomen is soft.     Tenderness: There is no abdominal tenderness.  Musculoskeletal:        General: No swelling. Normal range of motion.     Cervical back: Neck supple.  Skin:    General: Skin is warm and dry.  Neurological:     General: No focal deficit  present.     Mental Status: She is alert.  Psychiatric:        Mood and Affect: Mood normal.     ED Results / Procedures / Treatments   EKG None  Procedures Procedures  Medications Ordered in the ED Medications  ondansetron  (ZOFRAN ) injection 4 mg (4 mg Intravenous Given 09/24/23 0142)  sodium chloride  0.9 % bolus 1,000 mL (1,000 mLs Intravenous New Bag/Given 09/24/23 0145)    Initial Impression and Plan  Patient here with flu-like symptoms including persistent vomiting during the day, appears dry. Will check labs and give IVF and zofran .   ED Course   Clinical Course as of 09/24/23 0243  Sun Sep 24, 2023  0140 HCG is neg. UA has ketones, no signs of infection.  [CS]  0206 CBC with mild leukopenia, BMP is unremarkable.  [CS]  0209 Covid/Flu/RSV swab positive for influenza [CS]  0241 Patient feeling better, no further vomiting. Recommend symptomatic care at home. Oral hydration, rest and isolation.  [CS]    Clinical Course User Index [CS] Roselyn Carlin NOVAK, MD     MDM Rules/Calculators/A&P Medical Decision Making Problems Addressed: Influenza A: acute illness or injury  Amount and/or Complexity of Data Reviewed Labs: ordered. Decision-making details documented in ED Course.  Risk Prescription drug management.     Final Clinical Impression(s) / ED Diagnoses Final diagnoses:  Influenza A    Rx / DC Orders ED Discharge Orders     None        Roselyn Carlin NOVAK, MD 09/24/23 [phone]

## 2023-10-02 ENCOUNTER — Other Ambulatory Visit (HOSPITAL_COMMUNITY): Payer: Self-pay | Admitting: Physician Assistant

## 2023-10-02 DIAGNOSIS — M5412 Radiculopathy, cervical region: Secondary | ICD-10-CM

## 2023-10-10 ENCOUNTER — Other Ambulatory Visit (HOSPITAL_COMMUNITY): Payer: Self-pay | Admitting: Physician Assistant

## 2023-10-10 DIAGNOSIS — M5412 Radiculopathy, cervical region: Secondary | ICD-10-CM

## 2023-10-10 DIAGNOSIS — M5416 Radiculopathy, lumbar region: Secondary | ICD-10-CM

## 2023-10-14 ENCOUNTER — Ambulatory Visit (HOSPITAL_COMMUNITY)
Admission: RE | Admit: 2023-10-14 | Discharge: 2023-10-14 | Disposition: A | Discharge status: Home / Self Care | Payer: Medicaid Other | Source: Ambulatory Visit | Attending: Physician Assistant | Admitting: Physician Assistant

## 2023-10-14 DIAGNOSIS — M5416 Radiculopathy, lumbar region: Secondary | ICD-10-CM

## 2023-10-14 DIAGNOSIS — M5412 Radiculopathy, cervical region: Secondary | ICD-10-CM | POA: Diagnosis present

## 2024-05-08 ENCOUNTER — Ambulatory Visit (INDEPENDENT_AMBULATORY_CARE_PROVIDER_SITE_OTHER): Admitting: Gastroenterology

## 2024-05-08 ENCOUNTER — Ambulatory Visit (HOSPITAL_COMMUNITY)
Admission: RE | Admit: 2024-05-08 | Discharge: 2024-05-08 | Disposition: A | Discharge status: Home / Self Care | Source: Ambulatory Visit | Attending: Gastroenterology | Admitting: Gastroenterology

## 2024-05-08 ENCOUNTER — Encounter (INDEPENDENT_AMBULATORY_CARE_PROVIDER_SITE_OTHER): Payer: Self-pay | Admitting: Gastroenterology

## 2024-05-08 ENCOUNTER — Telehealth (INDEPENDENT_AMBULATORY_CARE_PROVIDER_SITE_OTHER): Payer: Self-pay

## 2024-05-08 VITALS — BP 105/69 | HR 66 | Temp 98.1°F | Ht 66.0 in | Wt 124.7 lb

## 2024-05-08 DIAGNOSIS — K5904 Chronic idiopathic constipation: Secondary | ICD-10-CM | POA: Diagnosis present

## 2024-05-08 DIAGNOSIS — R1319 Other dysphagia: Secondary | ICD-10-CM | POA: Insufficient documentation

## 2024-05-08 DIAGNOSIS — R1310 Dysphagia, unspecified: Secondary | ICD-10-CM | POA: Diagnosis not present

## 2024-05-08 DIAGNOSIS — R634 Abnormal weight loss: Secondary | ICD-10-CM | POA: Diagnosis not present

## 2024-05-08 DIAGNOSIS — K5909 Other constipation: Secondary | ICD-10-CM | POA: Diagnosis not present

## 2024-05-08 DIAGNOSIS — K581 Irritable bowel syndrome with constipation: Secondary | ICD-10-CM | POA: Insufficient documentation

## 2024-05-08 DIAGNOSIS — R1084 Generalized abdominal pain: Secondary | ICD-10-CM | POA: Insufficient documentation

## 2024-05-08 DIAGNOSIS — K921 Melena: Secondary | ICD-10-CM | POA: Diagnosis not present

## 2024-05-08 MED ORDER — PEG 3350-KCL-NA BICARB-NACL 420 G PO SOLR: 4000.0000 mL | Freq: Once | ORAL | 0 refills | Status: AC

## 2024-05-08 MED ORDER — PSYLLIUM 58.6 % PO PACK: 1.0000 | PACK | Freq: Two times a day (BID) | ORAL | 2 refills | Status: AC

## 2024-05-08 MED ORDER — POLYETHYLENE GLYCOL 3350 17 G PO PACK: 17.0000 g | PACK | Freq: Two times a day (BID) | ORAL | 0 refills | Status: AC

## 2024-05-08 MED ORDER — LINACLOTIDE 290 MCG PO CAPS: 290.0000 ug | ORAL_CAPSULE | Freq: Every day | ORAL | 1 refills | Status: AC

## 2024-05-08 NOTE — Patient Instructions (Signed)
 It was very nice to meet you today, as dicussed with will plan for the following :  1) xray today  2) Golytely  4 liters  3)Ensure adequate fluid intake: Aim for 8 glasses of water daily. Follow a high fiber diet: Include foods such as dates, prunes, pears, and kiwi. Take Miralax  twice a day for the first week, then reduce to once daily thereafter. Use Metamucil twice a day.  4) CT Scan  5) Labwork  6) Upper endoscopy and colonoscopy

## 2024-05-08 NOTE — Progress Notes (Addendum)
 Lynn Spencer , M.D. Gastroenterology & Hepatology [facility] [address] [place], [place] [address] Primary Care Physician: Orpha Yancey LABOR, MD [address] [place] [address]  Chief Complaint: Unintentional weight loss, dysphagia and severe constipation/hematochezia  History of Present Illness: Lynn Spencer is a 38 y.o. female with no significant comorbidities who presents for evaluation of dysphagia, abdominal pain, unintentional weight loss and severe constipation  Patient presents with multiple gastrointestinal complaints including progressive dysphagia, chronic constipation, significant unintentional weight loss, acid reflux symptoms, and hemorrhoids. She reports that in December her weight was 165 lbs, and today she weighs 124 lbs, reflecting a 41 lb weight loss over ~8 months. She states she is unable to tolerate oral intake, as "everything makes her sick." She endorses intermittent nausea and vomiting, partially relieved with ondansetron  (Zofran ). Constipation has been severe, with her last bowel movement approximately 2 weeks ago. When she does have bowel movements, they are Bristol Stool Scale type 1. She reports associated bright red blood per rectum, which she attributes to hemorrhoids, though she continues to have intermittent rectal bleeding. She also reports acid reflux symptoms despite taking OTC acid-reducing medications with minimal to no improvement. She endorses abdominal pain, intermittent in nature.  Patient reports passing gas this morning .  Denies melena, hematemesis, fevers, chills, or recent sick contacts.  Labs from 09/2023 hemoglobin 12.8 AST 14 ALT 10 Labs from 04/2024 hemoglobin 13.2 normal liver enzymes Iron saturation 55 Hemoglobin A1c 5.1 Vitamin D12.9 Ferritin 91   Last ZHI:wnwz Last Colonoscopy:none  Past Medical History: Past Medical History:  Diagnosis Date   Abnormal Pap smear     Anxiety    Constipation    Degenerative disc disease, cervical    Depression    Diabetes mellitus without complication (HCC)    Headache(784.0)    Hemorrhoids    HPV (human papilloma virus) infection    Hx of chlamydia infection    Hx of gonorrhea    Hx of syphilis    Pregnant 05/27/2013    Past Surgical History: Past Surgical History:  Procedure Laterality Date   COLPOSCOPY  2009   NO PAST SURGERIES      Family History: Family History  Problem Relation Age of Onset   Alcohol abuse Father    Asthma Maternal Grandmother    Diabetes Maternal Grandmother    Hypertension Maternal Grandmother     Social History: Social History   Tobacco Use  Smoking Status Every Day   Current packs/day: 0.50   Average packs/day: 0.5 packs/day for 10.0 years (5.0 ttl pk-yrs)   Types: Cigarettes  Smokeless Tobacco Never   Social History   Substance and Sexual Activity  Alcohol Use No   Comment: Occ; not now   Social History   Substance and Sexual Activity  Drug Use Yes   Types: Marijuana   Comment: november 4    Allergies: Allergies  Allergen Reactions   Dimetapp Cold-Allergy [Brompheniramine-Phenylephrine ] Other (See Comments)    Eyes roll in back of head   Hydrocodone Hives and Nausea And Vomiting   Robitussin (Alcohol Free) [Guaifenesin] Other (See Comments)    Eyes roll in back of head   Tramadol Hives and Rash    Medications: Current Outpatient Medications  Medication Sig Dispense Refill   Aspirin-Acetaminophen -Caffeine (GOODY HEADACHE PO) Take 1 Package by mouth daily as needed (pain).     linaclotide  (LINZESS ) 290 MCG CAPS capsule Take 1 capsule (290 mcg total) by mouth  daily before breakfast. 90 capsule 1   mirtazapine (REMERON) 30 MG tablet Take 30 mg by mouth at bedtime.     ondansetron  (ZOFRAN  ODT) 4 MG disintegrating tablet Take 1 tablet (4 mg total) by mouth every 8 (eight) hours as needed for nausea or vomiting. 20 tablet 0   polyethylene glycol (MIRALAX  /  GLYCOLAX ) 17 g packet Take 17 g by mouth 2 (two) times daily. 180 packet 0   polyethylene glycol-electrolytes (NULYTELY ) 420 g solution Take 4,000 mLs by mouth once for 1 dose. 4000 mL 0   psyllium (METAMUCIL) 58.6 % packet Take 1 packet by mouth 2 (two) times daily. 60 packet 2   SUBOXONE 8-2 MG FILM Place under the tongue daily.     Vitamin D, Ergocalciferol, (DRISDOL) 1.25 MG (50000 UNIT) CAPS capsule Take 50,000 Units by mouth once a week.     No current facility-administered medications for this visit.    Review of Systems: GENERAL: negative for malaise, night sweats HEENT: No changes in hearing or vision, no nose bleeds or other nasal problems. NECK: Negative for lumps, goiter, pain and significant neck swelling RESPIRATORY: Negative for cough, wheezing CARDIOVASCULAR: Negative for chest pain, leg swelling, palpitations, orthopnea GI: SEE HPI MUSCULOSKELETAL: Negative for joint pain or swelling, back pain, and muscle pain. SKIN: Negative for lesions, rash HEMATOLOGY Negative for prolonged bleeding, bruising easily, and swollen nodes. ENDOCRINE: Negative for cold or heat intolerance, polyuria, polydipsia and goiter. NEURO: negative for tremor, gait imbalance, syncope and seizures. The remainder of the review of systems is noncontributory.   Physical Exam: BP 105/69   Pulse 66   Temp 98.1 F (36.7 C)   Ht 5' 6 (1.676 m)   Wt 124 lb 11.2 oz (56.6 kg)   LMP 05/07/2024 (Exact Date)   BMI 20.13 kg/m  GENERAL: The patient is AO x3, in no acute distress. HEENT: Head is normocephalic and atraumatic. EOMI are intact. Mouth is well hydrated and without lesions. NECK: Supple. No masses LUNGS: Clear to auscultation. No presence of rhonchi/wheezing/rales. Adequate chest expansion HEART: RRR, normal s1 and s2. ABDOMEN: Soft, nontender, no guarding, no peritoneal signs, and nondistended. BS +. No masses.  Imaging/Labs: as above     Latest Ref Rng & Units 09/24/2023    1:28 AM  01/28/2018    5:39 AM 02/16/2016   12:21 AM  CBC  WBC 4.0 - 10.5 K/uL 2.9  7.4  12.6   Hemoglobin 12.0 - 15.0 g/dL 87.1  88.5  88.9   Hematocrit 36.0 - 46.0 % 38.2  34.1  31.9   Platelets 150 - 400 K/uL 275  211  174    No results found for: IRON, TIBC, FERRITIN  I personally reviewed and interpreted the available labs, imaging and endoscopic files.   CT 2016  Narrative & Impression  CLINICAL DATA:  39 year old female with sharp intermittent left-sided flank pain nausea and vomiting   EXAM: CT ABDOMEN AND PELVIS WITH CONTRAST   TECHNIQUE: Multidetector CT imaging of the abdomen and pelvis was performed using the standard protocol following bolus administration of intravenous contrast.   CONTRAST:  OMNIPAQUE  IOHEXOL  300 MG/ML  SOLN   COMPARISON:  None.   FINDINGS: Evaluation of this exam is limited due to respiratory motion artifact.   The visualized lung bases are clear. No intra-abdominal free air. Trace free fluid within the pelvis.   The liver, gallbladder, pancreas, spleen, adrenal glands appear unremarkable. Subcentimeter left renal hypodense lesion is too small to characterize.  A punctate nonobstructing left renal calculus noted. There is no hydronephrosis or obstructing stone on either side. The visualized ureters and urinary bladder appear unremarkable. The uterus is anteverted and grossly unremarkable. The visualized ovaries appear unremarkable.   Oral contrast is noted within the stomach and loops of mid small bowel. There is thickened appearance of the proximal duodenal loop in the right hemiabdomen likely representing duodenitis. Other etiologies including mural hematoma or an infiltrative process is less likely but not entirely excluded. Evaluation of this loop of duodenum is very limited due to suboptimal opacification with contrast. Clinical correlation and follow-up recommended. There is no evidence of bowel obstruction. The appendix is  unremarkable.   The abdominal aorta and IVC appear unremarkable. No portal venous gas identified. There is no lymphadenopathy. The abdominal wall soft tissues and visualized osseous structures appear grossly unremarkable.   IMPRESSION: Thickened loops of proximal small bowel and duodenum in the right hemi abdomen most compatible with duodenitis. Other etiologies including mural hematoma or infiltrative process is less likely but not entirely excluded. Clinical correlation and follow-up recommended. No bowel obstruction. Normal appendix.    Impression and Plan: Lynn Spencer is a 38 y.o. female with no significant comorbidities who presents for evaluation of dysphagia, abdominal pain, unintentional weight loss and severe constipation   #Unintentional weight loss #Dysphagia #Hematochezia #Severe constipation  Patient reports 40 pound unintentional weight loss in past 8 months.  Given continued dysphagia this could be esophageal web ring stricture or eosinophilic esophagitis which I cannot rule out without upper endoscopy  Also pain is hematochezia in setting of severe chronic constipation could be because of hemorrhoidal bleed although patient has CT finding of small bowel thickening back in 2016 and need to rule out inflammatory bowel disease  Will plan for bidirectional endoscopy to evaluate cause of dysphagia, hematochezia unintentional weight loss  Given unintentional weight loss and persistent abdominal discomfort will obtain cross-sectional imaging to ensure any overt pathology  Patient is unlikely obstructed as she is passing gas and abdominal exam today was benign  Will obtain abdominal x-ray today to evaluate stool burden after which we will give bowel purge with 4 L GoLytely   Patient to be maintained on Linzess  290 mcg thereafter  Will obtain blood work for CRP, alpha gal and celiac fecal calprotectin  May need anal manometry in future to rule out defecatory  disorder if constipation persist   All questions were answered.      Lynn Abts Faizan Maydelin Deming, MD Gastroenterology and Hepatology [facility]   This chart has been completed using [facility] Dictation software, and while attempts have been made to ensure accuracy , certain words and phrases may not be transcribed as intended

## 2024-05-08 NOTE — Telephone Encounter (Signed)
 Order ID: [id number]       Authorized  Approval Valid Through: 05/08/2024 - 07/06/2024

## 2024-05-08 NOTE — Telephone Encounter (Signed)
 Called pt, aware of CT appt details 9/16, arrival 2:00pm to start drinking oral contrast. She voiced understanding.

## 2024-05-23 ENCOUNTER — Ambulatory Visit (INDEPENDENT_AMBULATORY_CARE_PROVIDER_SITE_OTHER): Payer: Self-pay | Admitting: Gastroenterology

## 2024-05-23 MED ORDER — PEG 3350-KCL-NA BICARB-NACL 420 G PO SOLR
4000.0000 mL | Freq: Once | ORAL | 0 refills | Status: AC
Start: 2024-05-23 — End: 2024-05-23

## 2024-05-23 NOTE — Progress Notes (Signed)
 Xray abdomen   IMPRESSION: Moderate to large retained stool in the colon.

## 2024-05-27 ENCOUNTER — Telehealth: Payer: Self-pay | Admitting: *Deleted

## 2024-05-27 DIAGNOSIS — K921 Melena: Secondary | ICD-10-CM

## 2024-05-27 DIAGNOSIS — R1319 Other dysphagia: Secondary | ICD-10-CM

## 2024-05-27 DIAGNOSIS — R634 Abnormal weight loss: Secondary | ICD-10-CM

## 2024-05-27 MED ORDER — PEG 3350-KCL-NA BICARB-NACL 420 G PO SOLR: 4000.0000 mL | Freq: Once | ORAL | 0 refills | Status: AC

## 2024-05-27 NOTE — Telephone Encounter (Signed)
 Patient called in about prep she received from pharmacy. Advised her of abd xray results and what she had to do with the rx to purge colon. She voiced understanding

## 2024-05-27 NOTE — Telephone Encounter (Signed)
 Spoke with pt. She has been scheduled with Dr. Cinderella for TCS/EGD/ED with Dr. Cinderella, any room on 10/3. Aware will mail instructions and rx for prep sent to pharmacy.  When trying to send in rx for trylite, received message Drug Class Match with BROMPHENIRAMINE-PHENYLEPHRINE  (Class: POLYETHYLENE GLYCOL). Eyes roll in back of head  Please advise Dr. Cinderella if okay to still send in rx?

## 2024-05-27 NOTE — Addendum Note (Signed)
 Addended by: JEANELL GRAEME RAMAN on: 05/27/2024 03:37 PM   Modules accepted: Orders

## 2024-05-27 NOTE — Telephone Encounter (Signed)
 Yes can use Golytely /Nulytely 

## 2024-05-28 ENCOUNTER — Ambulatory Visit (HOSPITAL_COMMUNITY)
Admission: RE | Admit: 2024-05-28 | Discharge: 2024-05-28 | Disposition: A | Discharge status: Home / Self Care | Source: Ambulatory Visit | Attending: Gastroenterology | Admitting: Gastroenterology

## 2024-05-28 DIAGNOSIS — K921 Melena: Secondary | ICD-10-CM | POA: Diagnosis present

## 2024-05-28 DIAGNOSIS — R634 Abnormal weight loss: Secondary | ICD-10-CM | POA: Insufficient documentation

## 2024-05-28 DIAGNOSIS — R1084 Generalized abdominal pain: Secondary | ICD-10-CM | POA: Insufficient documentation

## 2024-05-28 MED ORDER — IOHEXOL 9 MG/ML PO SOLN
500.0000 mL | ORAL | Status: AC
  Administered 2024-05-28: 500 mL via ORAL

## 2024-05-28 MED ORDER — IOHEXOL 300 MG/ML  SOLN
100.0000 mL | Freq: Once | INTRAMUSCULAR | Status: AC | PRN
  Administered 2024-05-28: 100 mL via INTRAVENOUS

## 2024-05-30 NOTE — Progress Notes (Signed)
 Hi Tanya ,  Can you please call the patient and tell the patient the CT scan did not show anything concerning which is good news  Thanks,  Fany Cavanaugh Faizan Mansur Patti, MD Gastroenterology and Hepatology [facility]astroenterology ===========  CT IMPRESSION: Negative. No acute findings or other significant abnormality.

## 2024-06-12 ENCOUNTER — Other Ambulatory Visit: Payer: Self-pay

## 2024-06-12 ENCOUNTER — Encounter (HOSPITAL_COMMUNITY): Payer: Self-pay

## 2024-06-12 ENCOUNTER — Encounter (HOSPITAL_COMMUNITY)
Admission: RE | Admit: 2024-06-12 | Discharge: 2024-06-12 | Disposition: A | Discharge status: Home / Self Care | Source: Ambulatory Visit | Attending: Gastroenterology | Admitting: Gastroenterology

## 2024-06-12 DIAGNOSIS — Z01818 Encounter for other preprocedural examination: Secondary | ICD-10-CM

## 2024-06-12 NOTE — Progress Notes (Signed)
 Called pt. No answer. Left VM.

## 2024-06-13 ENCOUNTER — Other Ambulatory Visit (HOSPITAL_COMMUNITY)
Admission: RE | Admit: 2024-06-13 | Discharge: 2024-06-13 | Disposition: A | Discharge status: Home / Self Care | Source: Ambulatory Visit | Attending: Gastroenterology | Admitting: Gastroenterology

## 2024-06-13 DIAGNOSIS — R1319 Other dysphagia: Secondary | ICD-10-CM | POA: Diagnosis present

## 2024-06-13 DIAGNOSIS — R634 Abnormal weight loss: Secondary | ICD-10-CM | POA: Insufficient documentation

## 2024-06-13 DIAGNOSIS — K921 Melena: Secondary | ICD-10-CM | POA: Diagnosis present

## 2024-06-13 LAB — PREGNANCY, URINE: Preg Test, Ur: NEGATIVE

## 2024-06-14 ENCOUNTER — Ambulatory Visit (HOSPITAL_COMMUNITY)
Admission: RE | Admit: 2024-06-14 | Discharge: 2024-06-14 | Disposition: A | Discharge status: Home / Self Care | Attending: Gastroenterology | Admitting: Gastroenterology

## 2024-06-14 ENCOUNTER — Ambulatory Visit (HOSPITAL_COMMUNITY): Admitting: Anesthesiology

## 2024-06-14 ENCOUNTER — Other Ambulatory Visit: Payer: Self-pay

## 2024-06-14 ENCOUNTER — Encounter (HOSPITAL_COMMUNITY): Admission: RE | Disposition: A | Payer: Self-pay | Source: Home / Self Care | Attending: Gastroenterology

## 2024-06-14 ENCOUNTER — Encounter (HOSPITAL_COMMUNITY): Payer: Self-pay | Admitting: Gastroenterology

## 2024-06-14 DIAGNOSIS — K2970 Gastritis, unspecified, without bleeding: Secondary | ICD-10-CM | POA: Diagnosis not present

## 2024-06-14 DIAGNOSIS — K219 Gastro-esophageal reflux disease without esophagitis: Secondary | ICD-10-CM

## 2024-06-14 DIAGNOSIS — R1084 Generalized abdominal pain: Secondary | ICD-10-CM | POA: Insufficient documentation

## 2024-06-14 DIAGNOSIS — K648 Other hemorrhoids: Secondary | ICD-10-CM | POA: Diagnosis not present

## 2024-06-14 DIAGNOSIS — R634 Abnormal weight loss: Secondary | ICD-10-CM | POA: Diagnosis not present

## 2024-06-14 DIAGNOSIS — F418 Other specified anxiety disorders: Secondary | ICD-10-CM

## 2024-06-14 DIAGNOSIS — Z87891 Personal history of nicotine dependence: Secondary | ICD-10-CM | POA: Diagnosis not present

## 2024-06-14 DIAGNOSIS — F419 Anxiety disorder, unspecified: Secondary | ICD-10-CM | POA: Diagnosis not present

## 2024-06-14 DIAGNOSIS — K2100 Gastro-esophageal reflux disease with esophagitis, without bleeding: Secondary | ICD-10-CM | POA: Insufficient documentation

## 2024-06-14 DIAGNOSIS — R1310 Dysphagia, unspecified: Secondary | ICD-10-CM | POA: Insufficient documentation

## 2024-06-14 DIAGNOSIS — R194 Change in bowel habit: Secondary | ICD-10-CM | POA: Insufficient documentation

## 2024-06-14 DIAGNOSIS — Z01818 Encounter for other preprocedural examination: Secondary | ICD-10-CM

## 2024-06-14 DIAGNOSIS — F32A Depression, unspecified: Secondary | ICD-10-CM | POA: Insufficient documentation

## 2024-06-14 HISTORY — PX: ESOPHAGOGASTRODUODENOSCOPY: SHX5428

## 2024-06-14 HISTORY — PX: COLONOSCOPY: SHX5424

## 2024-06-14 HISTORY — PX: ESOPHAGEAL DILATION: SHX303

## 2024-06-14 SURGERY — COLONOSCOPY
Anesthesia: Monitor Anesthesia Care

## 2024-06-14 MED ORDER — LACTATED RINGERS IV SOLN: INTRAVENOUS | Status: DC | PRN

## 2024-06-14 MED ORDER — PHENYLEPHRINE 80 MCG/ML (10ML) SYRINGE FOR IV PUSH (FOR BLOOD PRESSURE SUPPORT)
PREFILLED_SYRINGE | INTRAVENOUS | Status: AC
  Filled 2024-06-14: qty 10

## 2024-06-14 MED ORDER — PHENYLEPHRINE HCL (PRESSORS) 10 MG/ML IV SOLN
INTRAVENOUS | Status: AC
  Filled 2024-06-14: qty 1

## 2024-06-14 MED ORDER — GLYCOPYRROLATE PF 0.2 MG/ML IJ SOSY
PREFILLED_SYRINGE | INTRAMUSCULAR | Status: AC
  Filled 2024-06-14: qty 1

## 2024-06-14 MED ORDER — PANTOPRAZOLE SODIUM 40 MG PO TBEC: 40.0000 mg | DELAYED_RELEASE_TABLET | Freq: Every day | ORAL | 1 refills | Status: DC

## 2024-06-14 MED ORDER — LACTATED RINGERS IV SOLN: INTRAVENOUS | Status: DC

## 2024-06-14 MED ORDER — PROPOFOL 500 MG/50ML IV EMUL
INTRAVENOUS | Status: AC
  Filled 2024-06-14: qty 50

## 2024-06-14 MED ORDER — PROPOFOL 10 MG/ML IV BOLUS
INTRAVENOUS | Status: DC | PRN
Start: 2024-06-14 — End: 2024-06-14
  Administered 2024-06-14 (×4): 100 mg via INTRAVENOUS

## 2024-06-14 MED ORDER — GLYCOPYRROLATE 0.2 MG/ML IJ SOLN
INTRAMUSCULAR | Status: DC | PRN
Start: 2024-06-14 — End: 2024-06-14
  Administered 2024-06-14: .1 mg via INTRAVENOUS

## 2024-06-14 NOTE — Discharge Instructions (Addendum)
  Discharge instructions Please read the instructions outlined below and refer to this sheet in the next few weeks. These discharge instructions provide you with general information on caring for yourself after you leave the hospital. Your doctor may also give you specific instructions. While your treatment has been planned according to the most current medical practices available, unavoidable complications occasionally occur. If you have any problems or questions after discharge, please call your doctor. ACTIVITY You may resume your regular activity but move at a slower pace for the next 24 hours.  Take frequent rest periods for the next 24 hours.  Walking will help expel (get rid of) the air and reduce the bloated feeling in your abdomen.  No driving for 24 hours (because of the anesthesia (medicine) used during the test).  You may shower.  Do not sign any important legal documents or operate any machinery for 24 hours (because of the anesthesia used during the test).  NUTRITION Drink plenty of fluids.  You may resume your normal diet.  Begin with a light meal and progress to your normal diet.  Avoid alcoholic beverages for 24 hours or as instructed by your caregiver.  MEDICATIONS You may resume your normal medications unless your caregiver tells you otherwise.  WHAT YOU CAN EXPECT TODAY You may experience abdominal discomfort such as a feeling of fullness or "gas" pains.  FOLLOW-UP Your doctor will discuss the results of your test with you.  SEEK IMMEDIATE MEDICAL ATTENTION IF ANY OF THE FOLLOWING OCCUR: Excessive nausea (feeling sick to your stomach) and/or vomiting.  Severe abdominal pain and distention (swelling).  Trouble swallowing.  Temperature over 101 F (37.8 C).  Rectal bleeding or vomiting of blood.     Start protonix  40mg   Avoid using high dose aspirin including Goody/BC powders, NSAIDs such as Aleve , ibuprofen , naproxen , Motrin , Voltaren or Advil  (even the topical  ones)  I hope you have a great rest of your week!   Lynn Spencer , M.D.. Gastroenterology and Hepatology [facility]

## 2024-06-14 NOTE — Op Note (Signed)
 [facility] Patient Name: Lynn Spencer Procedure Date: 06/14/2024 10:40 AM MRN: [id number] Date of Birth: 27-Nov-1985 Attending MD: Deatrice Dine , MD, [id number] CSN: [id number] Age: 38 Admit Type: Outpatient Procedure:                Colonoscopy Indications:              Generalized abdominal pain, Change in bowel habits,                            Weight loss Providers:                Deatrice Dine, MD, Madelin Hunter, RN, [place] Wilson,                            Technician Referring MD:              Medicines:                Monitored Anesthesia Care Complications:            No immediate complications. Estimated Blood Loss:     Estimated blood loss: none. Procedure:                Pre-Anesthesia Assessment:                           - Prior to the procedure, a History and Physical                            was performed, and patient medications and                            allergies were reviewed. The patient's tolerance of                            previous anesthesia was also reviewed. The risks                            and benefits of the procedure and the sedation                            options and risks were discussed with the patient.                            All questions were answered, and informed consent                            was obtained. Prior Anticoagulants: The patient has                            taken no anticoagulant or antiplatelet agents. ASA                            Grade Assessment: II - A patient with mild systemic  disease. After reviewing the risks and benefits,                            the patient was deemed in satisfactory condition to                            undergo the procedure.                           After obtaining informed consent, the colonoscope                            was passed under direct vision. Throughout the                            procedure, the patient's blood pressure,  pulse, and                            oxygen saturations were monitored continuously. The                            [id number] ([id number]) Colon was introduced through                            the anus and advanced to the the terminal ileum. Scope In: 11:10:53 AM Scope Out: 11:22:12 AM Scope Withdrawal Time: 0 hours 8 minutes 58 seconds  Total Procedure Duration: 0 hours 11 minutes 19 seconds  Findings:      The perianal and digital rectal examinations were normal.      There is no endoscopic evidence of inflammation or ulcerations in the       entire colon. Biopsies for histology were taken with a cold forceps for       evaluation of microscopic colitis.      Non-bleeding internal hemorrhoids were found during retroflexion. The       hemorrhoids were small.      The terminal ileum appeared normal. Impression:               - Non-bleeding internal hemorrhoids.                           - The examined portion of the ileum was normal.                           -Rectal fullness seen on examination, ?vaginocele Moderate Sedation:      Per Anesthesia Care Recommendation:           - Patient has a contact number available for                            emergencies. The signs and symptoms of potential                            delayed complications were discussed with the  patient. Return to normal activities tomorrow.                            Written discharge instructions were provided to the                            patient.                           - Resume previous diet.                           - Continue present medications.                           - Await pathology results.                           - Repeat colonoscopy in 10 years for screening                            purposes.                           - Return to GI office as previously scheduled.                           -Continue miralax  and linzess                            -If symptoms  persist may obatin anal HRM+BET and                            MRI Pelvis Procedure Code(s):        --- Professional ---                           [phone], Colonoscopy, flexible; with biopsy, single                            or multiple Diagnosis Code(s):        --- Professional ---                           K64.8, Other hemorrhoids                           R10.84, Generalized abdominal pain                           R19.4, Change in bowel habit                           R63.4, Abnormal weight loss CPT copyright 2022 [employer] Association. All rights reserved. The codes documented in this report are preliminary and upon coder review may  be revised to meet current compliance requirements. Deatrice Dine, MD Deatrice Dine, MD 06/14/2024 11:29:45 AM This report has been signed electronically. Number of  Addenda: 0

## 2024-06-14 NOTE — Op Note (Addendum)
 [facility] Patient Name: Lynn Spencer Procedure Date: 06/14/2024 10:41 AM MRN: [id number] Date of Birth: 10/17/1985 Attending MD: Deatrice Dine , MD, [id number] CSN: [id number] Age: 38 Admit Type: Outpatient Procedure:                Upper GI endoscopy Indications:              Epigastric abdominal pain, Dysphagia, Esophageal                            reflux symptoms that persist despite appropriate                            therapy Providers:                Deatrice Dine, MD, Madelin Hunter, RN, [place] Wilson,                            Technician Referring MD:              Medicines:                Monitored Anesthesia Care Complications:            No immediate complications. Estimated Blood Loss:     Estimated blood loss was minimal. Procedure:                Pre-Anesthesia Assessment:                           - Prior to the procedure, a History and Physical                            was performed, and patient medications and                            allergies were reviewed. The patient's tolerance of                            previous anesthesia was also reviewed. The risks                            and benefits of the procedure and the sedation                            options and risks were discussed with the patient.                            All questions were answered, and informed consent                            was obtained. Prior Anticoagulants: The patient has                            taken no anticoagulant or antiplatelet agents. ASA                            Grade Assessment: II -  A patient with mild systemic                            disease. After reviewing the risks and benefits,                            the patient was deemed in satisfactory condition to                            undergo the procedure.                           After obtaining informed consent, the endoscope was                            passed under direct vision.  Throughout the                            procedure, the patient's blood pressure, pulse, and                            oxygen saturations were monitored continuously. The                            [id number] ([id number]) Upper was introduced through                            the mouth, and advanced to the second part of                            duodenum. The upper GI endoscopy was accomplished                            without difficulty. The patient tolerated the                            procedure well. Scope In: 10:56:28 AM Scope Out: 11:04:15 AM Total Procedure Duration: 0 hours 7 minutes 47 seconds  Findings:      No endoscopic abnormality was evident in the esophagus to explain the       patient's complaint of dysphagia. It was decided, however, to proceed       with dilation of the middle third of the esophagus and of the lower       third of the esophagus. A TTS dilator was passed through the scope.       Dilation with a 15-16.5-18 mm balloon dilator was performed to 18 mm.       The dilation site was examined following endoscope reinsertion and       showed mild mucosal disruption. Biopsies were obtained from the proximal       and distal esophagus with cold forceps for histology of suspected       eosinophilic esophagitis.      Mild inflammation characterized by erosions was found in the gastric       antrum. Biopsies were taken with a cold forceps for histology.  The duodenal bulb and second portion of the duodenum were normal.       Biopsies for histology were taken with a cold forceps for evaluation of       celiac disease. Impression:               - No endoscopic esophageal abnormality to explain                            patient's dysphagia. Esophagus dilated. Dilated.                           - Gastritis. Biopsied.                           - Normal duodenal bulb and second portion of the                            duodenum. Biopsied.                            - Biopsies were taken with a cold forceps for                            evaluation of eosinophilic esophagitis. Moderate Sedation:      Per Anesthesia Care Recommendation:           - Patient has a contact number available for                            emergencies. The signs and symptoms of potential                            delayed complications were discussed with the                            patient. Return to normal activities tomorrow.                            Written discharge instructions were provided to the                            patient.                           - Resume previous diet.                           - Continue present medications.                           - Await pathology results. Procedure Code(s):        --- Professional ---                           [id number], Esophagogastroduodenoscopy, flexible,  transoral; with transendoscopic balloon dilation of                            esophagus (less than 30 mm diameter)                           43239, 59, Esophagogastroduodenoscopy, flexible,                            transoral; with biopsy, single or multiple Diagnosis Code(s):        --- Professional ---                           R13.10, Dysphagia, unspecified                           K29.70, Gastritis, unspecified, without bleeding                           R10.13, Epigastric pain                           K21.9, Gastro-esophageal reflux disease without                            esophagitis CPT copyright 2022 [employer]. All rights reserved. The codes documented in this report are preliminary and upon coder review may  be revised to meet current compliance requirements. Deatrice Dine, MD Deatrice Dine, MD 06/14/2024 11:08:12 AM This report has been signed electronically. Number of Addenda: 0

## 2024-06-14 NOTE — Anesthesia Preprocedure Evaluation (Signed)
 Anesthesia Evaluation  Patient identified by MRN, date of birth, ID band Patient awake    Reviewed: Allergy & Precautions, H&P , NPO status , Patient's Chart, lab work & pertinent test results, reviewed documented beta blocker date and time   Airway Mallampati: II  TM Distance: >3 FB Neck ROM: full    Dental no notable dental hx. (+) Dental Advisory Given, Teeth Intact   Pulmonary neg pulmonary ROS, former smoker   Pulmonary exam normal breath sounds clear to auscultation       Cardiovascular Exercise Tolerance: Good negative cardio ROS Normal cardiovascular exam Rhythm:regular Rate:Normal     Neuro/Psych  Headaches PSYCHIATRIC DISORDERS Anxiety Depression       GI/Hepatic negative GI ROS, Neg liver ROS,,,  Endo/Other  negative endocrine ROS    Renal/GU negative Renal ROS  negative genitourinary   Musculoskeletal   Abdominal   Peds  Hematology negative hematology ROS (+)   Anesthesia Other Findings   Reproductive/Obstetrics negative OB ROS                              Anesthesia Physical Anesthesia Plan  ASA: 2  Anesthesia Plan: General   Post-op Pain Management: Minimal or no pain anticipated   Induction: Intravenous  PONV Risk Score and Plan: Propofol infusion  Airway Management Planned: Natural Airway and Nasal Cannula  Additional Equipment: None  Intra-op Plan:   Post-operative Plan:   Informed Consent: I have reviewed the patients History and Physical, chart, labs and discussed the procedure including the risks, benefits and alternatives for the proposed anesthesia with the patient or authorized representative who has indicated his/her understanding and acceptance.     Dental Advisory Given  Plan Discussed with: CRNA  Anesthesia Plan Comments:         Anesthesia Quick Evaluation

## 2024-06-14 NOTE — Transfer of Care (Signed)
 Immediate Anesthesia Transfer of Care Note  Patient: Lynn Spencer  Procedure(s) Performed: COLONOSCOPY EGD (ESOPHAGOGASTRODUODENOSCOPY) DILATION, ESOPHAGUS  Patient Location: PACU  Anesthesia Type:MAC  Level of Consciousness: awake, alert , and patient cooperative  Airway & Oxygen Therapy: Patient Spontanous Breathing  Post-op Assessment: Report given to RN, Post -op Vital signs reviewed and stable, and Patient moving all extremities X 4  Post vital signs: Reviewed and stable  Last Vitals:  Vitals Value Taken Time  BP    Temp    Pulse    Resp    SpO2      Last Pain:  Vitals:   06/14/24 1051  TempSrc:   PainSc: 0-No pain      Patients Stated Pain Goal: 8 (06/14/24 1003)  Complications: No notable events documented.

## 2024-06-14 NOTE — H&P (Signed)
 Primary Care Physician:  Orpha Yancey LABOR, MD Primary Gastroenterologist:  Dr. Cinderella  Pre-Procedure History & Physical: HPI: Lynn Spencer is a 38 y.o. female with no significant comorbidities who presents for evaluation of dysphagia, abdominal pain, unintentional weight loss and severe constipation   Patient presents with multiple gastrointestinal complaints including progressive dysphagia, chronic constipation, significant unintentional weight loss, acid reflux symptoms, and hemorrhoids. She reports that in December her weight was 165 lbs, and today she weighs 124 lbs, reflecting a 41 lb weight loss over ~8 months. She states she is unable to tolerate oral intake, as "everything makes her sick." She endorses intermittent nausea and vomiting, partially relieved with ondansetron  (Zofran ). Constipation has been severe, When she does have bowel movements, they are Bristol Stool Scale type 1. She reports associated bright red blood per rectum, which she attributes to hemorrhoids, though she continues to have intermittent rectal bleeding. She also reports acid reflux symptoms despite taking OTC acid-reducing medications with minimal to no improvement. She endorses abdominal pain, intermittent in nature.  Patient reports passing gas this morning .  Denies melena, hematemesis, fevers, chills, or recent sick contacts.   Labs from 09/2023 hemoglobin 12.8 AST 14 ALT 10 Labs from 04/2024 hemoglobin 13.2 normal liver enzymes Iron saturation 55 Hemoglobin A1c 5.1 Vitamin D12.9 Ferritin 91     Last ZHI:wnwz Last Colonoscopy:none    Past Medical History:  Diagnosis Date   Abnormal Pap smear    Anxiety    Constipation    Degenerative disc disease, cervical    Depression    Headache(784.0)    Hemorrhoids    HPV (human papilloma virus) infection    Hx of chlamydia infection    Hx of gonorrhea    Hx of syphilis    Pregnant 05/27/2013    Past Surgical History:  Procedure Laterality Date    COLPOSCOPY  2009   NO PAST SURGERIES      Prior to Admission medications   Medication Sig Start Date End Date Taking? Authorizing Provider  Aspirin-Acetaminophen -Caffeine (GOODY HEADACHE PO) Take 1 Package by mouth daily as needed (pain).    [provider]  linaclotide  (LINZESS ) 290 MCG CAPS capsule Take 1 capsule (290 mcg total) by mouth daily before breakfast. 05/08/24 11/04/24  Berklie Dethlefs, Deatrice FALCON, MD  mirtazapine (REMERON) 30 MG tablet Take 30 mg by mouth at bedtime. 04/29/24   [provider]  ondansetron  (ZOFRAN  ODT) 4 MG disintegrating tablet Take 1 tablet (4 mg total) by mouth every 8 (eight) hours as needed for nausea or vomiting. 07/12/14   Jamelle Lorrayne CHRISTELLA, NP  polyethylene glycol (MIRALAX  / GLYCOLAX ) 17 g packet Take 17 g by mouth 2 (two) times daily. 05/08/24 08/06/24  Shamell Suarez F, MD  psyllium (METAMUCIL) 58.6 % packet Take 1 packet by mouth 2 (two) times daily. Patient not taking: Reported on 06/12/2024 05/08/24 08/06/24  Cinderella Deatrice FALCON, MD  SUBOXONE 8-2 MG FILM Place under the tongue daily. 05/05/24   [provider]  Vitamin D, Ergocalciferol, (DRISDOL) 1.25 MG (50000 UNIT) CAPS capsule Take 50,000 Units by mouth once a week. 05/02/24   [provider]    Allergies as of 05/27/2024 - Review Complete 05/08/2024  Allergen Reaction Noted   Dimetapp cold-allergy [brompheniramine-phenylephrine ] Other (See Comments) 04/09/2012   Hydrocodone Hives and Nausea And Vomiting 05/16/2013   Robitussin (alcohol free) [guaifenesin] Other (See Comments) 04/09/2012   Tramadol Hives and Rash 04/09/2012    Family History  Problem Relation Age of Onset  Alcohol abuse Father    Asthma Maternal Grandmother    Diabetes Maternal Grandmother    Hypertension Maternal Grandmother     Social History   Socioeconomic History   Marital status: Single    Spouse name: Not on file   Number of children: Not on file   Years of education: Not on file   Highest  education level: Not on file  Occupational History   Not on file  Tobacco Use   Smoking status: Former    Types: Cigarettes   Smokeless tobacco: Never  Substance and Sexual Activity   Alcohol use: No    Comment: Occ; not now   Drug use: Yes    Types: Marijuana    Comment: november 4   Sexual activity: Yes    Birth control/protection: None  Other Topics Concern   Not on file  Social History Narrative   Not on file   Social Drivers of Health   Financial Resource Strain: Not on file  Food Insecurity: Not on file  Transportation Needs: Not on file  Physical Activity: Not on file  Stress: Not on file  Social Connections: Not on file  Intimate Partner Violence: Not on file    Review of Systems: See HPI, otherwise negative ROS  Physical Exam: Vital signs in last 24 hours:     General:   Alert,  Well-developed, well-nourished, pleasant and cooperative in NAD Head:  Normocephalic and atraumatic. Eyes:  Sclera clear, no icterus.   Conjunctiva pink. Ears:  Normal auditory acuity. Nose:  No deformity, discharge,  or lesions. Msk:  Symmetrical without gross deformities. Normal posture. Extremities:  Without clubbing or edema. Neurologic:  Alert and  oriented x4;  grossly normal neurologically. Skin:  Intact without significant lesions or rashes. Psych:  Alert and cooperative. Normal mood and affect.  Impression/Plan:  Lynn Spencer is a 38 y.o. female with no significant comorbidities who presents for evaluation of dysphagia, abdominal pain, unintentional weight loss and severe constipation    Proceed with Upper endoscopy and colonoscopy   The risks of the procedure including infection, bleed, or perforation as well as benefits, limitations, alternatives and imponderables have been reviewed with the patient. Questions have been answered. All parties agreeable.

## 2024-06-14 NOTE — Anesthesia Postprocedure Evaluation (Signed)
 Anesthesia Post Note  Patient: Lynn Spencer  Procedure(s) Performed: COLONOSCOPY EGD (ESOPHAGOGASTRODUODENOSCOPY) DILATION, ESOPHAGUS  Patient location during evaluation: Phase II Anesthesia Type: General Level of consciousness: awake and alert Pain management: pain level controlled Vital Signs Assessment: post-procedure vital signs reviewed and stable Respiratory status: spontaneous breathing, nonlabored ventilation and respiratory function stable Cardiovascular status: stable Anesthetic complications: no   There were no known notable events for this encounter.   Last Vitals:  Vitals:   06/14/24 1003 06/14/24 1130  BP: 109/72 114/76  Pulse: 62 88  Resp: (!) 8 20  Temp: 36.9 C 36.7 C  SpO2:  95%    Last Pain:  Vitals:   06/14/24 1130  TempSrc: Oral  PainSc: 0-No pain                 Charitie Hinote L Tequisha Maahs

## 2024-06-17 LAB — SURGICAL PATHOLOGY

## 2024-06-18 ENCOUNTER — Ambulatory Visit (INDEPENDENT_AMBULATORY_CARE_PROVIDER_SITE_OTHER): Payer: Self-pay | Admitting: Gastroenterology

## 2024-06-20 ENCOUNTER — Encounter (HOSPITAL_COMMUNITY): Payer: Self-pay | Admitting: Gastroenterology

## 2024-06-27 NOTE — Progress Notes (Signed)
 10 yr TCS noted in recall Patient result letter mailed procedure note and pathology result faxed to PCP

## 2024-07-10 ENCOUNTER — Encounter: Admitting: Advanced Practice Midwife

## 2024-07-12 ENCOUNTER — Encounter (INDEPENDENT_AMBULATORY_CARE_PROVIDER_SITE_OTHER): Payer: Self-pay | Admitting: Gastroenterology

## 2024-07-24 ENCOUNTER — Other Ambulatory Visit (INDEPENDENT_AMBULATORY_CARE_PROVIDER_SITE_OTHER): Payer: Self-pay | Admitting: Gastroenterology

## 2024-08-13 ENCOUNTER — Other Ambulatory Visit (HOSPITAL_COMMUNITY): Payer: Self-pay | Admitting: Nurse Practitioner

## 2024-08-13 DIAGNOSIS — N63 Unspecified lump in unspecified breast: Secondary | ICD-10-CM

## 2024-08-13 DIAGNOSIS — N920 Excessive and frequent menstruation with regular cycle: Secondary | ICD-10-CM

## 2024-08-13 DIAGNOSIS — N644 Mastodynia: Secondary | ICD-10-CM

## 2024-08-20 ENCOUNTER — Ambulatory Visit (HOSPITAL_COMMUNITY): Admission: RE | Admit: 2024-08-20

## 2024-08-20 ENCOUNTER — Encounter (HOSPITAL_COMMUNITY): Payer: Self-pay

## 2024-09-10 ENCOUNTER — Encounter (HOSPITAL_COMMUNITY): Payer: Self-pay | Admitting: Radiology

## 2024-09-10 ENCOUNTER — Ambulatory Visit (HOSPITAL_COMMUNITY)
Admission: RE | Admit: 2024-09-10 | Discharge: 2024-09-10 | Disposition: A | Discharge status: Home / Self Care | Source: Ambulatory Visit | Attending: Nurse Practitioner | Admitting: Nurse Practitioner

## 2024-09-10 DIAGNOSIS — N63 Unspecified lump in unspecified breast: Secondary | ICD-10-CM | POA: Insufficient documentation

## 2024-09-10 DIAGNOSIS — N644 Mastodynia: Secondary | ICD-10-CM | POA: Insufficient documentation

## 2024-09-16 ENCOUNTER — Encounter: Admitting: Obstetrics & Gynecology

## 2024-09-17 ENCOUNTER — Ambulatory Visit (HOSPITAL_COMMUNITY)
Admission: RE | Admit: 2024-09-17 | Discharge: 2024-09-17 | Disposition: A | Discharge status: Home / Self Care | Source: Ambulatory Visit | Attending: Nurse Practitioner | Admitting: Nurse Practitioner

## 2024-09-17 DIAGNOSIS — N920 Excessive and frequent menstruation with regular cycle: Secondary | ICD-10-CM | POA: Insufficient documentation

## 2024-10-15 ENCOUNTER — Encounter (HOSPITAL_COMMUNITY): Payer: Self-pay | Admitting: *Deleted

## 2024-10-15 ENCOUNTER — Other Ambulatory Visit: Payer: Self-pay

## 2024-10-15 ENCOUNTER — Emergency Department (HOSPITAL_COMMUNITY)

## 2024-10-15 ENCOUNTER — Inpatient Hospital Stay (HOSPITAL_COMMUNITY): Admission: EM | Admit: 2024-10-15 | Discharge: 2024-10-17 | DRG: 690 | Disposition: A

## 2024-10-15 DIAGNOSIS — Z888 Allergy status to other drugs, medicaments and biological substances status: Secondary | ICD-10-CM

## 2024-10-15 DIAGNOSIS — B9620 Unspecified Escherichia coli [E. coli] as the cause of diseases classified elsewhere: Secondary | ICD-10-CM | POA: Diagnosis present

## 2024-10-15 DIAGNOSIS — Z87891 Personal history of nicotine dependence: Secondary | ICD-10-CM

## 2024-10-15 DIAGNOSIS — F32A Depression, unspecified: Secondary | ICD-10-CM | POA: Diagnosis present

## 2024-10-15 DIAGNOSIS — F419 Anxiety disorder, unspecified: Secondary | ICD-10-CM | POA: Diagnosis present

## 2024-10-15 DIAGNOSIS — K219 Gastro-esophageal reflux disease without esophagitis: Secondary | ICD-10-CM | POA: Diagnosis present

## 2024-10-15 DIAGNOSIS — Z885 Allergy status to narcotic agent status: Secondary | ICD-10-CM

## 2024-10-15 DIAGNOSIS — R54 Age-related physical debility: Secondary | ICD-10-CM | POA: Diagnosis present

## 2024-10-15 DIAGNOSIS — Z883 Allergy status to other anti-infective agents status: Secondary | ICD-10-CM

## 2024-10-15 DIAGNOSIS — M5030 Other cervical disc degeneration, unspecified cervical region: Secondary | ICD-10-CM | POA: Diagnosis present

## 2024-10-15 DIAGNOSIS — Z833 Family history of diabetes mellitus: Secondary | ICD-10-CM

## 2024-10-15 DIAGNOSIS — K5909 Other constipation: Secondary | ICD-10-CM | POA: Diagnosis present

## 2024-10-15 DIAGNOSIS — Z811 Family history of alcohol abuse and dependence: Secondary | ICD-10-CM

## 2024-10-15 DIAGNOSIS — N12 Tubulo-interstitial nephritis, not specified as acute or chronic: Principal | ICD-10-CM | POA: Diagnosis present

## 2024-10-15 DIAGNOSIS — E876 Hypokalemia: Secondary | ICD-10-CM | POA: Diagnosis present

## 2024-10-15 DIAGNOSIS — Z79899 Other long term (current) drug therapy: Secondary | ICD-10-CM

## 2024-10-15 DIAGNOSIS — Z8249 Family history of ischemic heart disease and other diseases of the circulatory system: Secondary | ICD-10-CM

## 2024-10-15 DIAGNOSIS — Z825 Family history of asthma and other chronic lower respiratory diseases: Secondary | ICD-10-CM

## 2024-10-15 DIAGNOSIS — N10 Acute tubulo-interstitial nephritis: Principal | ICD-10-CM | POA: Diagnosis present

## 2024-10-15 LAB — URINALYSIS, ROUTINE W REFLEX MICROSCOPIC
Bilirubin Urine: NEGATIVE
Glucose, UA: NEGATIVE mg/dL
Ketones, ur: 20 mg/dL — AB
Nitrite: NEGATIVE
Protein, ur: 100 mg/dL — AB
Specific Gravity, Urine: 1.013 (ref 1.005–1.030)
WBC, UA: >50 WBC/hpf (ref 0–5)
pH: 6 (ref 5.0–8.0)

## 2024-10-15 LAB — CBC WITH DIFFERENTIAL/PLATELET
Abs Immature Granulocytes: 0.04 10*3/uL (ref 0.00–0.07)
Basophils Absolute: 0 10*3/uL (ref 0.0–0.1)
Basophils Relative: 0 %
Eosinophils Absolute: 0 10*3/uL (ref 0.0–0.5)
Eosinophils Relative: 0 %
HCT: 37.6 % (ref 36.0–46.0)
Hemoglobin: 12.9 g/dL (ref 12.0–15.0)
Immature Granulocytes: 0 %
Lymphocytes Relative: 9 %
Lymphs Abs: 1 10*3/uL (ref 0.7–4.0)
MCH: 30.1 pg (ref 26.0–34.0)
MCHC: 34.3 g/dL (ref 30.0–36.0)
MCV: 87.9 fL (ref 80.0–100.0)
Monocytes Absolute: 1 10*3/uL (ref 0.1–1.0)
Monocytes Relative: 10 %
Neutro Abs: 8.4 10*3/uL — ABNORMAL HIGH (ref 1.7–7.7)
Neutrophils Relative %: 81 %
Platelets: 258 10*3/uL (ref 150–400)
RBC: 4.28 MIL/uL (ref 3.87–5.11)
RDW: 11.2 % — ABNORMAL LOW (ref 11.5–15.5)
WBC: 10.4 10*3/uL (ref 4.0–10.5)
nRBC: 0 % (ref 0.0–0.2)

## 2024-10-15 LAB — BASIC METABOLIC PANEL WITH GFR
Anion gap: 16 — ABNORMAL HIGH (ref 5–15)
BUN: 7 mg/dL (ref 6–20)
CO2: 21 mmol/L — ABNORMAL LOW (ref 22–32)
Calcium: 9.3 mg/dL (ref 8.9–10.3)
Chloride: 98 mmol/L (ref 98–111)
Creatinine, Ser: 0.78 mg/dL (ref 0.44–1.00)
GFR, Estimated: >60 mL/min
Glucose, Bld: 99 mg/dL (ref 70–99)
Potassium: 3.2 mmol/L — ABNORMAL LOW (ref 3.5–5.1)
Sodium: 136 mmol/L (ref 135–145)

## 2024-10-15 LAB — PREGNANCY, URINE: Preg Test, Ur: NEGATIVE

## 2024-10-15 MED ORDER — SODIUM CHLORIDE 0.9 % IV SOLN
1.0000 g | Freq: Once | INTRAVENOUS | Status: AC
  Administered 2024-10-15: 1 g via INTRAVENOUS
  Filled 2024-10-15: qty 10

## 2024-10-15 MED ORDER — FENTANYL CITRATE (PF) 100 MCG/2ML IJ SOLN
25.0000 ug | Freq: Once | INTRAMUSCULAR | Status: AC
  Administered 2024-10-15: 25 ug via INTRAVENOUS
  Filled 2024-10-15: qty 2

## 2024-10-15 MED ORDER — LACTATED RINGERS IV BOLUS
1000.0000 mL | Freq: Once | INTRAVENOUS | Status: AC
  Administered 2024-10-15: 1000 mL via INTRAVENOUS

## 2024-10-15 MED ORDER — MORPHINE SULFATE (PF) 4 MG/ML IV SOLN
4.0000 mg | Freq: Once | INTRAVENOUS | Status: AC
  Administered 2024-10-15: 4 mg via INTRAVENOUS
  Filled 2024-10-15: qty 1

## 2024-10-15 MED ORDER — IOHEXOL 300 MG/ML  SOLN
100.0000 mL | Freq: Once | INTRAMUSCULAR | Status: AC | PRN
  Administered 2024-10-15: 100 mL via INTRAVENOUS

## 2024-10-15 MED ORDER — KETOROLAC TROMETHAMINE 15 MG/ML IJ SOLN
15.0000 mg | Freq: Once | INTRAMUSCULAR | Status: AC
  Administered 2024-10-15: 15 mg via INTRAVENOUS
  Filled 2024-10-15: qty 1

## 2024-10-15 MED ORDER — POTASSIUM CHLORIDE CRYS ER 20 MEQ PO TBCR
40.0000 meq | EXTENDED_RELEASE_TABLET | Freq: Once | ORAL | Status: AC
  Administered 2024-10-15: 40 meq via ORAL
  Filled 2024-10-15: qty 2

## 2024-10-15 NOTE — ED Triage Notes (Signed)
 Pt with RLQ pain sharp started today.  Pt states she has had UTI for weeks and taking AZO at home. Pt seen at [facility] and sent here. + burning with urination. + n/V + chills

## 2024-10-16 DIAGNOSIS — N12 Tubulo-interstitial nephritis, not specified as acute or chronic: Principal | ICD-10-CM | POA: Diagnosis present

## 2024-10-16 LAB — BASIC METABOLIC PANEL WITH GFR
Anion gap: 12 (ref 5–15)
BUN: 6 mg/dL (ref 6–20)
CO2: 25 mmol/L (ref 22–32)
Calcium: 8.8 mg/dL — ABNORMAL LOW (ref 8.9–10.3)
Chloride: 101 mmol/L (ref 98–111)
Creatinine, Ser: 0.72 mg/dL (ref 0.44–1.00)
GFR, Estimated: >60 mL/min
Glucose, Bld: 113 mg/dL — ABNORMAL HIGH (ref 70–99)
Potassium: 3.5 mmol/L (ref 3.5–5.1)
Sodium: 138 mmol/L (ref 135–145)

## 2024-10-16 LAB — CBC
HCT: 33.4 % — ABNORMAL LOW (ref 36.0–46.0)
Hemoglobin: 11.5 g/dL — ABNORMAL LOW (ref 12.0–15.0)
MCH: 29.9 pg (ref 26.0–34.0)
MCHC: 34.4 g/dL (ref 30.0–36.0)
MCV: 87 fL (ref 80.0–100.0)
Platelets: 204 10*3/uL (ref 150–400)
RBC: 3.84 MIL/uL — ABNORMAL LOW (ref 3.87–5.11)
RDW: 11.1 % — ABNORMAL LOW (ref 11.5–15.5)
WBC: 8.5 10*3/uL (ref 4.0–10.5)
nRBC: 0 % (ref 0.0–0.2)

## 2024-10-16 LAB — HIV ANTIBODY (ROUTINE TESTING W REFLEX): HIV Screen 4th Generation wRfx: NONREACTIVE

## 2024-10-16 MED ORDER — CLONAZEPAM 0.5 MG PO TABS
0.5000 mg | ORAL_TABLET | Freq: Once | ORAL | Status: AC
  Administered 2024-10-16: 0.5 mg via ORAL
  Filled 2024-10-16: qty 1

## 2024-10-16 MED ORDER — OXYCODONE HCL 5 MG PO TABS
5.0000 mg | ORAL_TABLET | ORAL | Status: DC | PRN
  Administered 2024-10-16: 10 mg via ORAL
  Administered 2024-10-17: 5 mg via ORAL
  Administered 2024-10-17: 10 mg via ORAL
  Filled 2024-10-16 (×3): qty 1

## 2024-10-16 MED ORDER — LORATADINE 10 MG PO TABS
10.0000 mg | ORAL_TABLET | Freq: Every day | ORAL | Status: DC
  Administered 2024-10-17: 10 mg via ORAL
  Filled 2024-10-16: qty 1

## 2024-10-16 MED ORDER — OXYCODONE HCL 5 MG PO TABS
5.0000 mg | ORAL_TABLET | ORAL | Status: DC | PRN
  Filled 2024-10-16: qty 1

## 2024-10-16 MED ORDER — LINACLOTIDE 145 MCG PO CAPS
290.0000 ug | ORAL_CAPSULE | Freq: Every day | ORAL | Status: DC
  Administered 2024-10-17: 290 ug via ORAL
  Filled 2024-10-16: qty 2

## 2024-10-16 MED ORDER — ONDANSETRON HCL 4 MG PO TABS: 4.0000 mg | ORAL_TABLET | Freq: Four times a day (QID) | ORAL | Status: DC | PRN

## 2024-10-16 MED ORDER — FLUTICASONE PROPIONATE 50 MCG/ACT NA SUSP
2.0000 | Freq: Every day | NASAL | Status: DC
  Filled 2024-10-16 (×2): qty 16

## 2024-10-16 MED ORDER — SENNA 8.6 MG PO TABS
2.0000 | ORAL_TABLET | Freq: Two times a day (BID) | ORAL | Status: DC
  Administered 2024-10-16 – 2024-10-17 (×2): 17.2 mg via ORAL
  Filled 2024-10-16 (×3): qty 2

## 2024-10-16 MED ORDER — MORPHINE SULFATE (PF) 2 MG/ML IV SOLN
2.0000 mg | INTRAVENOUS | Status: DC | PRN
  Administered 2024-10-16 (×5): 2 mg via INTRAVENOUS
  Filled 2024-10-16 (×5): qty 1

## 2024-10-16 MED ORDER — ACETAMINOPHEN 650 MG RE SUPP: 650.0000 mg | Freq: Four times a day (QID) | RECTAL | Status: DC | PRN

## 2024-10-16 MED ORDER — ONDANSETRON HCL 4 MG PO TABS
8.0000 mg | ORAL_TABLET | Freq: Four times a day (QID) | ORAL | Status: DC | PRN
  Administered 2024-10-17: 8 mg via ORAL
  Filled 2024-10-16: qty 2

## 2024-10-16 MED ORDER — SODIUM CHLORIDE 0.9 % IV SOLN
1.0000 g | INTRAVENOUS | Status: AC
  Administered 2024-10-16: 1 g via INTRAVENOUS
  Filled 2024-10-16: qty 10

## 2024-10-16 MED ORDER — SODIUM CHLORIDE 0.9 % IV SOLN: INTRAVENOUS | Status: AC

## 2024-10-16 MED ORDER — ONDANSETRON HCL 4 MG/2ML IJ SOLN: 8.0000 mg | Freq: Once | INTRAMUSCULAR | Status: DC

## 2024-10-16 MED ORDER — ACETAMINOPHEN 325 MG PO TABS
650.0000 mg | ORAL_TABLET | Freq: Four times a day (QID) | ORAL | Status: DC | PRN
  Administered 2024-10-16 – 2024-10-17 (×3): 650 mg via ORAL
  Filled 2024-10-16 (×3): qty 2

## 2024-10-16 MED ORDER — CROMOLYN SODIUM 4 % OP SOLN: 1.0000 [drp] | Freq: Two times a day (BID) | OPHTHALMIC | Status: DC

## 2024-10-16 MED ORDER — CLONAZEPAM 0.5 MG PO TABS
0.5000 mg | ORAL_TABLET | Freq: Two times a day (BID) | ORAL | Status: DC
  Administered 2024-10-16 – 2024-10-17 (×2): 0.5 mg via ORAL
  Filled 2024-10-16 (×2): qty 1

## 2024-10-16 MED ORDER — ENOXAPARIN SODIUM 40 MG/0.4ML IJ SOSY
40.0000 mg | PREFILLED_SYRINGE | INTRAMUSCULAR | Status: DC
  Administered 2024-10-16 – 2024-10-17 (×2): 40 mg via SUBCUTANEOUS
  Filled 2024-10-16 (×2): qty 0.4

## 2024-10-16 MED ORDER — PANTOPRAZOLE SODIUM 40 MG PO TBEC
40.0000 mg | DELAYED_RELEASE_TABLET | Freq: Every day | ORAL | Status: DC
  Administered 2024-10-16 – 2024-10-17 (×2): 40 mg via ORAL
  Filled 2024-10-16 (×2): qty 1

## 2024-10-16 MED ORDER — SODIUM CHLORIDE 0.9 % IV SOLN: INTRAVENOUS | Status: DC

## 2024-10-16 MED ORDER — MAGNESIUM HYDROXIDE 400 MG/5ML PO SUSP
5.0000 mL | Freq: Every day | ORAL | Status: DC
  Administered 2024-10-16 – 2024-10-17 (×2): 5 mL via ORAL
  Filled 2024-10-16 (×2): qty 30

## 2024-10-16 MED ORDER — OXYCODONE HCL 5 MG PO TABS
2.5000 mg | ORAL_TABLET | ORAL | Status: DC | PRN
  Administered 2024-10-16 (×2): 2.5 mg via ORAL
  Filled 2024-10-16 (×2): qty 1

## 2024-10-16 MED ORDER — ONDANSETRON HCL 4 MG/2ML IJ SOLN
4.0000 mg | Freq: Four times a day (QID) | INTRAMUSCULAR | Status: DC | PRN
  Administered 2024-10-16: 4 mg via INTRAVENOUS
  Filled 2024-10-16: qty 2

## 2024-10-16 MED ORDER — CYCLOSPORINE 0.05 % OP EMUL
1.0000 [drp] | Freq: Two times a day (BID) | OPHTHALMIC | Status: DC
  Filled 2024-10-16 (×2): qty 30

## 2024-10-16 MED ORDER — ALUM & MAG HYDROXIDE-SIMETH 200-200-20 MG/5ML PO SUSP
15.0000 mL | ORAL | Status: DC | PRN
  Administered 2024-10-16: 15 mL via ORAL
  Filled 2024-10-16: qty 30

## 2024-10-16 MED ORDER — BISACODYL 10 MG RE SUPP
10.0000 mg | Freq: Every day | RECTAL | Status: DC | PRN
  Administered 2024-10-16: 10 mg via RECTAL
  Filled 2024-10-16: qty 1

## 2024-10-16 MED ORDER — MIRTAZAPINE 15 MG PO TABS
30.0000 mg | ORAL_TABLET | Freq: Every day | ORAL | Status: DC
  Administered 2024-10-16: 30 mg via ORAL
  Filled 2024-10-16: qty 2

## 2024-10-16 MED ORDER — ONDANSETRON HCL 4 MG/2ML IJ SOLN
8.0000 mg | Freq: Four times a day (QID) | INTRAMUSCULAR | Status: DC | PRN
  Administered 2024-10-16: 8 mg via INTRAVENOUS
  Filled 2024-10-16: qty 4

## 2024-10-16 MED ORDER — SODIUM CHLORIDE 0.9 % IV SOLN: 8.0000 mg | Freq: Four times a day (QID) | INTRAVENOUS | Status: DC | PRN

## 2024-10-16 NOTE — H&P (Signed)
 " History and Physical   Lynn Spencer FMW:[id number] DOB: 09/18/1985 DOA: 10/15/2024  Referring MD/NP/PA: Suellen BROCKS - PA PCP: Orpha Yancey LABOR, MD Patient coming from: Home  Chief Complaint: Right side flank pain   HPI: Lynn Spencer is a 39 y.o. female with a history of depression/anxiety, STDs, degenerative disc disease-cervical. Patient has experienced 3 to 4 days of right flank pain, which felt like a UTI and no relief despite OTC treatments (Azo).  She does have pain with urination.  She has had chills but no fever thus far.  ED Course: Positive for right CVA tenderness. Pain is reproducible. Mildly relieved by medications. She has had chills on and off, some nausea and vomiting. Hypokalemic, blood cultures have been drawn/pending. CT reveals right pyelonephritis. She was given ceftriaxone  1 g IV, along with pain control and antiemetic with improvement   Review of Systems: per HPI. All others reviewed and are negative.   Past Medical History:  Diagnosis Date   Abnormal Pap smear    Anxiety    Constipation    Degenerative disc disease, cervical    Depression    Headache(784.0)    Hemorrhoids    HPV (human papilloma virus) infection    Hx of chlamydia infection    Hx of gonorrhea    Hx of syphilis    Pregnant 05/27/2013   Past Surgical History:  Procedure Laterality Date   COLONOSCOPY N/A 06/14/2024   Procedure: COLONOSCOPY;  Surgeon: Cinderella Deatrice FALCON, MD;  [facility];  Service: Endoscopy;  Laterality: N/A;  11:00am, asa 1-2   COLPOSCOPY  2009   ESOPHAGEAL DILATION N/A 06/14/2024   Procedure: DILATION, ESOPHAGUS;  Surgeon: Cinderella Deatrice FALCON, MD;  Location: [facility];  Service: Endoscopy;  Laterality: N/A;   ESOPHAGOGASTRODUODENOSCOPY N/A 06/14/2024   Procedure: EGD (ESOPHAGOGASTRODUODENOSCOPY);  Surgeon: Cinderella Deatrice FALCON, MD;  Location: [facility];  Service: Endoscopy;  Laterality: N/A;   NO PAST SURGERIES     -   reports that she has quit  smoking. Her smoking use included cigarettes. She has never used smokeless tobacco. She reports current drug use. Drug: Marijuana. She reports that she does not drink alcohol. Allergies[1] Family History  Problem Relation Age of Onset   Alcohol abuse Father    Asthma Maternal Grandmother    Diabetes Maternal Grandmother    Hypertension Maternal Grandmother    - Family history otherwise reviewed and not pertinent.  Prior to Admission medications  Medication Sig Start Date End Date Taking? Authorizing Provider  linaclotide  (LINZESS ) 290 MCG CAPS capsule Take 1 capsule (290 mcg total) by mouth daily before breakfast. 05/08/24 11/04/24  Ahmed, Deatrice FALCON, MD  mirtazapine  (REMERON ) 30 MG tablet Take 30 mg by mouth at bedtime. 04/29/24   [provider]  ondansetron  (ZOFRAN  ODT) 4 MG disintegrating tablet Take 1 tablet (4 mg total) by mouth every 8 (eight) hours as needed for nausea or vomiting. 07/12/14   Jamelle Lorrayne CHRISTELLA, NP  pantoprazole  (PROTONIX ) 40 MG tablet TAKE 1 TABLET(40 MG) BY MOUTH DAILY 07/24/24   Ahmed, Muhammad F, MD  SUBOXONE 8-2 MG FILM Place under the tongue daily. 05/05/24   [provider]  Vitamin D, Ergocalciferol, (DRISDOL) 1.25 MG (50000 UNIT) CAPS capsule Take 50,000 Units by mouth once a week. 05/02/24   [provider]    Physical Exam: Vitals:   10/15/24 1820 10/15/24 1915 10/15/24 2211 10/16/24 0030  BP: 116/88 (!) 124/94 126/84 125/68  Pulse: 82  82 92  Resp: 20  18 17   Temp: 99.6 F (37.6 C)  98.6 F (37 C) 98.7 F (37.1 C)  TempSrc:   Oral Oral  SpO2: 100%  99% 98%  Weight:      Height:       Constitutional: 39 y.o. female in no distress,some obvious discomfort with pleasant demeanor Eyes: Lids and conjunctivae normal, PERRL ENMT: Mucous membranes are dry.  Neck: normal, supple, no masses, no thyromegaly Respiratory: Non-labored breathing without accessory muscle use. Clear breath sounds to auscultation bilaterally Cardiovascular:  Regular rate and rhythm, no murmurs, rubs, or gallops. No JVD. No edema.  pedal pulses present. Abdomen: Normoactive bowel sounds. RIGHT flank tenderness, non-distended, and no masses palpated. No hepatosplenomegaly. Musculoskeletal: No clubbing / cyanosis. No joint deformity upper and lower extremities. Good ROM, no contractures. Normal muscle tone.  Skin: Warm, dry. No rashes, wounds, ulcers. No significant lesions noted.  Neurologic: CN II-XII grossly intact.SABRA Speech normal. No focal deficits in motor strength or sensation in all extremities. Psychiatric: Alert and oriented x3. Normal judgment and insight. Mood pleasant with normal affect.   Labs on Admission: I have personally reviewed following labs and imaging studies  CBC: Recent Labs  Lab 10/15/24 1511  WBC 10.4  NEUTROABS 8.4*  HGB 12.9  HCT 37.6  MCV 87.9  PLT 258   Basic Metabolic Panel: Recent Labs  Lab 10/15/24 1511  NA 136  K 3.2*  CL 98  CO2 21*  GLUCOSE 99  BUN 7  CREATININE 0.78  CALCIUM 9.3   GFR: Estimated Creatinine Clearance: 88.4 mL/min (by C-G formula based on SCr of 0.78 mg/dL). Liver Function Tests: No results for input(s): AST, ALT, ALKPHOS, BILITOT, PROT, ALBUMIN in the last 168 hours. No results for input(s): LIPASE, AMYLASE in the last 168 hours. No results for input(s): AMMONIA in the last 168 hours. Coagulation Profile: No results for input(s): INR, PROTIME in the last 168 hours. Cardiac Enzymes: No results for input(s): CKTOTAL, CKMB, CKMBINDEX, TROPONINI in the last 168 hours. BNP (last 3 results) No results for input(s): PROBNP in the last 8760 hours. HbA1C: No results for input(s): HGBA1C in the last 72 hours. CBG: No results for input(s): GLUCAP in the last 168 hours. Lipid Profile: No results for input(s): CHOL, HDL, LDLCALC, TRIG, CHOLHDL, LDLDIRECT in the last 72 hours. Thyroid  Function Tests: No results for input(s): TSH,  T4TOTAL, FREET4, T3FREE, THYROIDAB in the last 72 hours. Anemia Panel: No results for input(s): VITAMINB12, FOLATE, FERRITIN, TIBC, IRON, RETICCTPCT in the last 72 hours. Urine analysis:    Component Value Date/Time   COLORURINE AMBER (A) 10/15/2024 1439   APPEARANCEUR CLOUDY (A) 10/15/2024 1439   LABSPEC 1.013 10/15/2024 1439   PHURINE 6.0 10/15/2024 1439   GLUCOSEU NEGATIVE 10/15/2024 1439   HGBUR MODERATE (A) 10/15/2024 1439   BILIRUBINUR NEGATIVE 10/15/2024 1439   KETONESUR 20 (A) 10/15/2024 1439   PROTEINUR 100 (A) 10/15/2024 1439   UROBILINOGEN 0.2 07/12/2014 0813   NITRITE NEGATIVE 10/15/2024 1439   LEUKOCYTESUR MODERATE (A) 10/15/2024 1439    Recent Results (from the past 240 hours)  Blood culture (routine x 2)     Status: None (Preliminary result)   Collection Time: 10/15/24  7:59 PM   Specimen: BLOOD  Result Value Ref Range Status   Specimen Description BLOOD RIGHT ANTECUBITAL  Final   Special Requests   Final    BOTTLES DRAWN AEROBIC AND ANAEROBIC Blood Culture results may not be optimal due to an inadequate  volume of blood received in culture bottles Performed at [facility], [address], [place], [place] [address]    Culture PENDING  Incomplete   Report Status PENDING  Incomplete  Blood culture (routine x 2)     Status: None (Preliminary result)   Collection Time: 10/15/24  8:04 PM   Specimen: BLOOD  Result Value Ref Range Status   Specimen Description BLOOD BLOOD RIGHT HAND  Final   Special Requests   Final    BOTTLES DRAWN AEROBIC AND ANAEROBIC Blood Culture results may not be optimal due to an inadequate volume of blood received in culture bottles Performed at [facility], [address] [place], [place] [address]    Culture PENDING  Incomplete   Report Status PENDING  Incomplete     Radiological Exams on Admission: CT ABDOMEN PELVIS W CONTRAST Result Date: 10/15/2024 EXAM: CT ABDOMEN AND PELVIS WITH CONTRAST 10/15/2024 07:45:53 PM  TECHNIQUE: CT of the abdomen and pelvis was performed with the administration of 100 mL of iohexol  (OMNIPAQUE ) 300 MG/ML solution. Multiplanar reformatted images are provided for review. Automated exposure control, iterative reconstruction, and/or weight-based adjustment of the mA/kV was utilized to reduce the radiation dose to as low as reasonably achievable. COMPARISON: 05/28/2024. CLINICAL HISTORY: Abdominal pain, acute, nonlocalized. Acute, nonlocalized abdominal pain. FINDINGS: LOWER CHEST: No acute abnormality. LIVER: The liver is unremarkable. GALLBLADDER AND BILE DUCTS: Gallbladder is unremarkable. No biliary ductal dilatation. SPLEEN: No acute abnormality. PANCREAS: No acute abnormality. ADRENAL GLANDS: No acute abnormality. KIDNEYS, URETERS AND BLADDER: Cortical geographic hypoattenuation within the right kidney compatible with pyelonephritis. Associated urothelial thickening and hyper-enhancement of the right ureter. Asymmetric stranding of the right kidney and ureter. No hydronephrosis. No urinary calculi. Urinary bladder is unremarkable. GI AND BOWEL: Stomach demonstrates no acute abnormality. There is no bowel obstruction. PERITONEUM AND RETROPERITONEUM: No ascites. No free air. VASCULATURE: Aorta is normal in caliber. Aortic atherosclerotic calcification. LYMPH NODES: No lymphadenopathy. REPRODUCTIVE ORGANS: No acute abnormality. BONES AND SOFT TISSUES: No acute osseous abnormality. No focal soft tissue abnormality. IMPRESSION: 1. Right pyelonephritis . Electronically signed by: Norman Gatlin MD 10/15/2024 07:51 PM EST RP Workstation: [id number]    Assessment/Plan Principal Problem:   Pyelonephritis IV Fluid Continue Antibiotics  Urine culture pending adjust antibiotics accordingly   Hypokalemia  Electrolyte replacement  Lab verification after replacement  Anxiety/depression Home meds after Pharmacy verification    DVT prophylaxis: Lovenox  Code Status: Full Family Communication:  bedside Disposition Plan: Home  Admission status: Telemetry   Lynwood Kipper BSN MSNA MSN ACNPC-AG Acute Care Nurse Practitioner [facility] [place]  10/16/2024, 1:59 AM     [1]  Allergies Allergen Reactions   Dimetapp Cold-Allergy [Brompheniramine-Phenylephrine ] Other (See Comments)    Eyes roll in back of head   Hydrocodone Hives and Nausea And Vomiting   Robitussin (Alcohol Free) [Guaifenesin] Other (See Comments)    Eyes roll in back of head   Tramadol Hives and Rash   "

## 2024-10-16 NOTE — Progress Notes (Signed)
 Transition of Care Department [facility]) has reviewed patient and no other TOC needs have been identified at this time. We will continue to monitor patient advancement through interdisciplinary progression rounds. If new patient transition needs arise, please place a TOC consult.   10/16/24 0759  TOC Brief Assessment  Insurance and Status Reviewed  Patient has primary care physician Yes  Home environment has been reviewed From home.  Prior level of function: Independent.  Prior/Current Home Services No current home services  Social Drivers of Health Review SDOH reviewed no interventions necessary  Readmission risk has been reviewed Yes  Transition of care needs no transition of care needs at this time

## 2024-10-16 NOTE — Progress Notes (Signed)
" ° °  Brief Progress Note   _____________________________________________________________________________________________________________  Patient Name: Lynn Spencer Patient DOB: Apr 03, 1986 Date: @TODAY @      Data: 39 yo female currently awaiting admission to a Telemetry bed at [facility].    Action: Reached out to Dr. Cosette to inqure about the potential downgrade of patient to med-surg level of care.    Response:  Dr. Cosette updated admission orders to reflect of med-surg level of care.  _____________________________________________________________________________________________________________  [facility] Patient Care Command RN Expeditor Kilan Banfill S Kobi Aller Please contact us  directly via secure chat (search for [facility]) or by calling us  at [phone] [facility]).  "

## 2024-10-16 NOTE — Progress Notes (Addendum)
 " PROGRESS NOTE    Lynn Spencer  FMW:[id number] DOB: April 22, 1986 DOA: 10/15/2024 PCP: Orpha Yancey LABOR, MD  Subjective: No acute events overnight. Seen and examined at bedside. Reports having ongoing R flank pain, nausea and vomiting. Reports also having chronic constipation and cannot recall when had last BM. Reports feeling weak and hasn't tried to ambulate yet    Hospital Course:  39 y.o. female with a history of depression/anxiety, STDs, degenerative disc disease-cervical. Patient presented with 3 to 4 days of right flank pain, which felt like a UTI and no relief despite OTC treatments (Azo).  She does have pain with urination.  She has had chills but no fever thus far. Admitted for acute pyelonephritis  Assessment and Plan:  Acute R pyelonephritis - confirmed on CT A/P  - prior Ucx (03/2018) with E. Coli resistant to bactrim - cont ceftriaxone  - cont IV fluids until PO intake improves - Blood cultures with NGTD, final read pending - UCx pending - may need repeat CT A/P if doesn't improve in 48 to 72 hours  Nausea/vomiting - likely in the setting of acute pyelonephritis - cont IV fluids until PO intake improves - cont zofran  PRN - encourage oral intake  Chronic constipation - no recent BM at home that she can recall or since hospital admission - cont home linzess  - start senna BID - start Milk of Mg daily - start bisacodyl  supp PRN - monitor for BMs  Chronic GERD - cont home pantoprazole   Chronic anxiety/depression - cont home clonazepam  - cont home mirtazapine    Acute debilitation - encourage OOB to chair during daytime - encourage ambulation  DVT prophylaxis: enoxaparin  (LOVENOX ) injection 40 mg Start: 10/16/24 1000  Lovenox    Code Status: Full Code  Disposition Plan: Home Reason for continuing need for hospitalization: IV antibiotics, monitor for clinical improvement  Objective: Vitals:   10/16/24 0530 10/16/24 0600 10/16/24 0800 10/16/24 0800  BP:  114/62 111/66 99/72   Pulse: 79 77 (!) 54   Resp: 17 18 10    Temp:    98.3 F (36.8 C)  TempSrc:    Oral  SpO2: 97% 95% 99%   Weight:      Height:        Intake/Output Summary (Last 24 hours) at 10/16/2024 1130 Last data filed at 10/15/2024 2225 Gross per 24 hour  Intake 1100 ml  Output --  Net 1100 ml   Filed Weights   10/15/24 1442  Weight: 68.9 kg    Examination:  Physical Exam Vitals and nursing note reviewed.  Constitutional:      General: She is not in acute distress.    Appearance: She is ill-appearing and diaphoretic. She is not toxic-appearing.     Comments: frail  Cardiovascular:     Rate and Rhythm: Normal rate and regular rhythm.     Pulses: Normal pulses.     Heart sounds: Normal heart sounds.  Pulmonary:     Effort: Pulmonary effort is normal.     Breath sounds: Normal breath sounds.  Abdominal:     General: Bowel sounds are normal.     Palpations: Abdomen is soft.  Musculoskeletal:     Right lower leg: No edema.     Left lower leg: No edema.  Neurological:     Mental Status: She is alert.     Data Reviewed: I have personally reviewed following labs and imaging studies  CBC: Recent Labs  Lab 10/15/24 1511 10/16/24 0249  WBC 10.4 8.5  NEUTROABS 8.4*  --   HGB 12.9 11.5*  HCT 37.6 33.4*  MCV 87.9 87.0  PLT 258 204   Basic Metabolic Panel: Recent Labs  Lab 10/15/24 1511 10/16/24 0249  NA 136 138  K 3.2* 3.5  CL 98 101  CO2 21* 25  GLUCOSE 99 113*  BUN 7 6  CREATININE 0.78 0.72  CALCIUM 9.3 8.8*   GFR: Estimated Creatinine Clearance: 88.4 mL/min (by C-G formula based on SCr of 0.72 mg/dL). Liver Function Tests: No results for input(s): AST, ALT, ALKPHOS, BILITOT, PROT, ALBUMIN in the last 168 hours. No results for input(s): LIPASE, AMYLASE in the last 168 hours. No results for input(s): AMMONIA in the last 168 hours. Coagulation Profile: No results for input(s): INR, PROTIME in the last 168 hours. Cardiac  Enzymes: No results for input(s): CKTOTAL, CKMB, CKMBINDEX, TROPONINI in the last 168 hours. ProBNP, BNP (last 5 results) No results for input(s): PROBNP, BNP in the last 8760 hours. HbA1C: No results for input(s): HGBA1C in the last 72 hours. CBG: No results for input(s): GLUCAP in the last 168 hours. Lipid Profile: No results for input(s): CHOL, HDL, LDLCALC, TRIG, CHOLHDL, LDLDIRECT in the last 72 hours. Thyroid  Function Tests: No results for input(s): TSH, T4TOTAL, FREET4, T3FREE, THYROIDAB in the last 72 hours. Anemia Panel: No results for input(s): VITAMINB12, FOLATE, FERRITIN, TIBC, IRON, RETICCTPCT in the last 72 hours. Sepsis Labs: No results for input(s): PROCALCITON, LATICACIDVEN in the last 168 hours.  Recent Results (from the past 240 hours)  Blood culture (routine x 2)     Status: None (Preliminary result)   Collection Time: 10/15/24  7:59 PM   Specimen: BLOOD  Result Value Ref Range Status   Specimen Description BLOOD RIGHT ANTECUBITAL  Final   Special Requests   Final    BOTTLES DRAWN AEROBIC AND ANAEROBIC Blood Culture results may not be optimal due to an inadequate volume of blood received in culture bottles   Culture   Final    NO GROWTH < 12 HOURS Performed at [facility], [address], [place], [place] [address]    Report Status PENDING  Incomplete  Blood culture (routine x 2)     Status: None (Preliminary result)   Collection Time: 10/15/24  8:04 PM   Specimen: BLOOD  Result Value Ref Range Status   Specimen Description BLOOD BLOOD RIGHT HAND  Final   Special Requests   Final    BOTTLES DRAWN AEROBIC AND ANAEROBIC Blood Culture results may not be optimal due to an inadequate volume of blood received in culture bottles   Culture   Final    NO GROWTH < 12 HOURS Performed at [facility], [address], [place], [place] [address]    Report Status PENDING  Incomplete     Radiology Studies: CT  ABDOMEN PELVIS W CONTRAST Result Date: 10/15/2024 EXAM: CT ABDOMEN AND PELVIS WITH CONTRAST 10/15/2024 07:45:53 PM TECHNIQUE: CT of the abdomen and pelvis was performed with the administration of 100 mL of iohexol  (OMNIPAQUE ) 300 MG/ML solution. Multiplanar reformatted images are provided for review. Automated exposure control, iterative reconstruction, and/or weight-based adjustment of the mA/kV was utilized to reduce the radiation dose to as low as reasonably achievable. COMPARISON: 05/28/2024. CLINICAL HISTORY: Abdominal pain, acute, nonlocalized. Acute, nonlocalized abdominal pain. FINDINGS: LOWER CHEST: No acute abnormality. LIVER: The liver is unremarkable. GALLBLADDER AND BILE DUCTS: Gallbladder is unremarkable. No biliary ductal dilatation. SPLEEN: No acute abnormality. PANCREAS: No acute abnormality. ADRENAL GLANDS: No acute abnormality.  KIDNEYS, URETERS AND BLADDER: Cortical geographic hypoattenuation within the right kidney compatible with pyelonephritis. Associated urothelial thickening and hyper-enhancement of the right ureter. Asymmetric stranding of the right kidney and ureter. No hydronephrosis. No urinary calculi. Urinary bladder is unremarkable. GI AND BOWEL: Stomach demonstrates no acute abnormality. There is no bowel obstruction. PERITONEUM AND RETROPERITONEUM: No ascites. No free air. VASCULATURE: Aorta is normal in caliber. Aortic atherosclerotic calcification. LYMPH NODES: No lymphadenopathy. REPRODUCTIVE ORGANS: No acute abnormality. BONES AND SOFT TISSUES: No acute osseous abnormality. No focal soft tissue abnormality. IMPRESSION: 1. Right pyelonephritis . Electronically signed by: Norman Gatlin MD 10/15/2024 07:51 PM EST RP Workstation: [id number]    Scheduled Meds:  enoxaparin  (LOVENOX ) injection  40 mg Subcutaneous Q24H   Continuous Infusions:  sodium chloride  40 mL/hr at 10/16/24 0801   cefTRIAXone  (ROCEPHIN )  IV       LOS: 0 days   Norval Bar, MD  [facility]  10/16/2024, 11:30 AM   "

## 2024-10-17 MED ORDER — OXYCODONE HCL 5 MG PO TABS: 5.0000 mg | ORAL_TABLET | ORAL | Status: DC | PRN

## 2024-10-17 MED ORDER — MAGNESIUM HYDROXIDE 400 MG/5ML PO SUSP: 5.0000 mL | Freq: Every day | ORAL | 0 refills | Status: AC

## 2024-10-17 MED ORDER — CIPROFLOXACIN HCL 500 MG PO TABS: 500.0000 mg | ORAL_TABLET | Freq: Two times a day (BID) | ORAL | 0 refills | Status: AC

## 2024-10-17 MED ORDER — SENNA 8.6 MG PO TABS: 2.0000 | ORAL_TABLET | Freq: Two times a day (BID) | ORAL | 0 refills | Status: AC

## 2024-10-17 MED ORDER — OXYCODONE HCL 5 MG PO TABS: 5.0000 mg | ORAL_TABLET | ORAL | 0 refills | Status: AC | PRN

## 2024-10-17 MED ORDER — ALUM & MAG HYDROXIDE-SIMETH 200-200-20 MG/5ML PO SUSP: 15.0000 mL | ORAL | 0 refills | Status: AC | PRN

## 2024-10-17 MED ORDER — BISACODYL 10 MG RE SUPP: 10.0000 mg | Freq: Every day | RECTAL | 0 refills | Status: AC | PRN

## 2024-10-17 NOTE — ED Notes (Signed)
 Pt given crackers.

## 2024-10-17 NOTE — Discharge Summary (Signed)
 " [facility] Physician Discharge Summary   Patient name: Lynn Spencer  Admit date:     10/15/2024  Discharge date: 10/17/2024  Attending Physician: SHONA TERRY SAILOR [[id number]]  Discharge Physician: Norval Bar   PCP: Orpha Yancey LABOR, MD  Admitted From: Home  Disposition:  Home  Recommendations for Outpatient Follow-up:  Follow up with PCP in 1-2 weeks Finish 7 days total antibiotic course  Home Health:No Equipment/Devices: @ECDMELIST @  Discharge Condition:Stable CODE STATUS:FULL Diet recommendation: Regular Fluid Restriction: None  Hospital Summary:  39 y.o. female with a history of depression/anxiety, STDs, degenerative disc disease-cervical. Patient presented with 3 to 4 days of right flank pain, which felt like a UTI and no relief despite OTC treatments (Azo).  She does have pain with urination.  She has had chills but no fever thus far. Admitted for acute pyelonephritis confirmed on CT A/P with positive urinalysis for UTI. Treated with ceftriaxone  and transitioned to ciprofloxacin  to finish 7 days total antibiotic course. Patient stable for discharge will need to follow up with PCP within one week for ongoing anxiety/depression management.   Discharge Diagnoses:  Principal Problem:   Pyelonephritis   Discharge Instructions  Discharge Instructions     Increase activity slowly   Complete by: As directed       Allergies as of 10/17/2024       Reactions   Dimetapp Cold-allergy [brompheniramine-phenylephrine ] Other (See Comments)   Eyes roll in back of head   Hydrocodone Hives, Nausea And Vomiting   Robitussin (alcohol Free) [guaifenesin] Other (See Comments)   Eyes roll in back of head   Tramadol Hives, Rash        Medication List     TAKE these medications    alum & mag hydroxide-simeth 200-200-20 MG/5ML suspension Commonly known as: MAALOX/MYLANTA Take 15 mLs by mouth every 4 (four) hours as needed for indigestion or heartburn.   bisacodyl  10  MG suppository Commonly known as: DULCOLAX Place 1 suppository (10 mg total) rectally daily as needed for severe constipation.   ciprofloxacin  500 MG tablet Commonly known as: Cipro  Take 1 tablet (500 mg total) by mouth 2 (two) times daily for 5 days.   clonazePAM  0.5 MG tablet Commonly known as: KLONOPIN  Take 0.5 mg by mouth 2 (two) times daily.   cromolyn  4 % ophthalmic solution Commonly known as: OPTICROM  Place 1 drop into both eyes 2 (two) times daily.   Depo-SubQ Provera  104 104 MG/0.65ML injection Generic drug: medroxyPROGESTERone  104 mg every 3 (three) months.   fluticasone  50 MCG/ACT nasal spray Commonly known as: FLONASE  Place 2 sprays into both nostrils daily.   linaclotide  290 MCG Caps capsule Commonly known as: Linzess  Take 1 capsule (290 mcg total) by mouth daily before breakfast.   loratadine  10 MG tablet Commonly known as: CLARITIN  Take 10 mg by mouth daily.   magnesium  hydroxide 400 MG/5ML suspension Commonly known as: MILK OF MAGNESIA Take 5 mLs by mouth daily. Start taking on: October 18, 2024   mirtazapine  30 MG tablet Commonly known as: REMERON  Take 30 mg by mouth at bedtime.   ondansetron  8 MG disintegrating tablet Commonly known as: ZOFRAN -ODT Take 8 mg by mouth 2 (two) times daily as needed for vomiting or nausea.   oxyCODONE  5 MG immediate release tablet Commonly known as: Oxy IR/ROXICODONE  Take 1-2 tablets (5-10 mg total) by mouth every 4 (four) hours as needed for up to 5 days for severe pain (pain score 7-10) or moderate pain (pain score 4-6).  pantoprazole  40 MG tablet Commonly known as: PROTONIX  TAKE 1 TABLET(40 MG) BY MOUTH DAILY   Restasis  0.05 % ophthalmic emulsion Generic drug: cycloSPORINE  Place 1 drop into both eyes 2 (two) times daily.   senna 8.6 MG Tabs tablet Commonly known as: SENOKOT Take 2 tablets (17.2 mg total) by mouth 2 (two) times daily.        Allergies[1]  Discharge Exam: Vitals:   10/17/24 0737  10/17/24 0800  BP:  132/85  Pulse:  88  Resp:  18  Temp: (!) 100.7 F (38.2 C)   SpO2:  97%    Physical Exam Vitals and nursing note reviewed.  Constitutional:      General: She is not in acute distress.    Appearance: She is ill-appearing.  HENT:     Head: Normocephalic and atraumatic.  Cardiovascular:     Rate and Rhythm: Normal rate and regular rhythm.     Pulses: Normal pulses.     Heart sounds: Normal heart sounds.  Pulmonary:     Effort: Pulmonary effort is normal.     Breath sounds: Normal breath sounds.  Abdominal:     General: Bowel sounds are normal.     Palpations: Abdomen is soft.  Neurological:     Mental Status: She is alert.     The results of significant diagnostics from this hospitalization (including imaging, microbiology, ancillary and laboratory) are listed below for reference.    Microbiology: Recent Results (from the past 240 hours)  Urine Culture     Status: Abnormal (Preliminary result)   Collection Time: 10/15/24  2:39 PM   Specimen: Urine, Clean Catch  Result Value Ref Range Status   Specimen Description   Final    URINE, CLEAN CATCH Performed at [facility], [address], [place], [place] [address]    Special Requests   Final    NONE Performed at [facility], [address], [place], [place] [address]    Culture (A)  Final    >=100,000 COLONIES/mL ESCHERICHIA COLI SUSCEPTIBILITIES TO FOLLOW Performed at [facility], [address] [place], [place] [address]    Report Status PENDING  Incomplete  Blood culture (routine x 2)     Status: None (Preliminary result)   Collection Time: 10/15/24  7:59 PM   Specimen: BLOOD  Result Value Ref Range Status   Specimen Description BLOOD RIGHT ANTECUBITAL  Final   Special Requests   Final    BOTTLES DRAWN AEROBIC AND ANAEROBIC Blood Culture results may not be optimal due to an inadequate volume of blood received in culture bottles   Culture   Final    NO GROWTH 2 DAYS Performed at  [facility], [address], [place], [place] [address]    Report Status PENDING  Incomplete  Blood culture (routine x 2)     Status: None (Preliminary result)   Collection Time: 10/15/24  8:04 PM   Specimen: BLOOD  Result Value Ref Range Status   Specimen Description BLOOD BLOOD RIGHT HAND  Final   Special Requests   Final    BOTTLES DRAWN AEROBIC AND ANAEROBIC Blood Culture results may not be optimal due to an inadequate volume of blood received in culture bottles   Culture   Final    NO GROWTH 2 DAYS Performed at [facility], [address], [place], [place] [address]    Report Status PENDING  Incomplete     Labs: ProBNP, BNP (last 5 results) No results for input(s):  PROBNP, BNP in the last 8760 hours. Basic Metabolic Panel: Recent Labs  Lab 10/15/24 1511 10/16/24 0249  NA 136 138  K 3.2* 3.5  CL 98 101  CO2 21* 25  GLUCOSE 99 113*  BUN 7 6  CREATININE 0.78 0.72  CALCIUM 9.3 8.8*   Liver Function Tests: No results for input(s): AST, ALT, ALKPHOS, BILITOT, PROT, ALBUMIN in the last 168 hours. No results for input(s): LIPASE, AMYLASE in the last 168 hours. No results for input(s): AMMONIA in the last 168 hours. CBC: Recent Labs  Lab 10/15/24 1511 10/16/24 0249  WBC 10.4 8.5  NEUTROABS 8.4*  --   HGB 12.9 11.5*  HCT 37.6 33.4*  MCV 87.9 87.0  PLT 258 204   Cardiac Enzymes: No results for input(s): CKTOTAL, CKMB, CKMBINDEX, TROPONINI, TROPONINIHS in the last 168 hours. BNP: No results for input(s): BNP in the last 168 hours. CBG: No results for input(s): GLUCAP in the last 168 hours. D-Dimer No results for input(s): DDIMER in the last 72 hours. Hgb A1c No results for input(s): HGBA1C in the last 72 hours. Lipid Profile No results for input(s): CHOL, HDL, LDLCALC, TRIG, CHOLHDL, LDLDIRECT in the last 72 hours. Thyroid  function studies No results for input(s): TSH, T4TOTAL, FREET4, T3FREE,  THYROIDAB in the last 72 hours.  Invalid input(s): FREET3 Anemia work up No results for input(s): VITAMINB12, FOLATE, FERRITIN, TIBC, IRON, RETICCTPCT in the last 72 hours. Urinalysis    Component Value Date/Time   COLORURINE AMBER (A) 10/15/2024 1439   APPEARANCEUR CLOUDY (A) 10/15/2024 1439   LABSPEC 1.013 10/15/2024 1439   PHURINE 6.0 10/15/2024 1439   GLUCOSEU NEGATIVE 10/15/2024 1439   HGBUR MODERATE (A) 10/15/2024 1439   BILIRUBINUR NEGATIVE 10/15/2024 1439   KETONESUR 20 (A) 10/15/2024 1439   PROTEINUR 100 (A) 10/15/2024 1439   UROBILINOGEN 0.2 07/12/2014 0813   NITRITE NEGATIVE 10/15/2024 1439   LEUKOCYTESUR MODERATE (A) 10/15/2024 1439   Sepsis Labs Recent Labs  Lab 10/15/24 1511 10/16/24 0249  WBC 10.4 8.5    Procedures/Studies: CT ABDOMEN PELVIS W CONTRAST Result Date: 10/15/2024 EXAM: CT ABDOMEN AND PELVIS WITH CONTRAST 10/15/2024 07:45:53 PM TECHNIQUE: CT of the abdomen and pelvis was performed with the administration of 100 mL of iohexol  (OMNIPAQUE ) 300 MG/ML solution. Multiplanar reformatted images are provided for review. Automated exposure control, iterative reconstruction, and/or weight-based adjustment of the mA/kV was utilized to reduce the radiation dose to as low as reasonably achievable. COMPARISON: 05/28/2024. CLINICAL HISTORY: Abdominal pain, acute, nonlocalized. Acute, nonlocalized abdominal pain. FINDINGS: LOWER CHEST: No acute abnormality. LIVER: The liver is unremarkable. GALLBLADDER AND BILE DUCTS: Gallbladder is unremarkable. No biliary ductal dilatation. SPLEEN: No acute abnormality. PANCREAS: No acute abnormality. ADRENAL GLANDS: No acute abnormality. KIDNEYS, URETERS AND BLADDER: Cortical geographic hypoattenuation within the right kidney compatible with pyelonephritis. Associated urothelial thickening and hyper-enhancement of the right ureter. Asymmetric stranding of the right kidney and ureter. No hydronephrosis. No urinary calculi.  Urinary bladder is unremarkable. GI AND BOWEL: Stomach demonstrates no acute abnormality. There is no bowel obstruction. PERITONEUM AND RETROPERITONEUM: No ascites. No free air. VASCULATURE: Aorta is normal in caliber. Aortic atherosclerotic calcification. LYMPH NODES: No lymphadenopathy. REPRODUCTIVE ORGANS: No acute abnormality. BONES AND SOFT TISSUES: No acute osseous abnormality. No focal soft tissue abnormality. IMPRESSION: 1. Right pyelonephritis . Electronically signed by: Norman Gatlin MD 10/15/2024 07:51 PM EST RP Workstation: [id number]   US  PELVIC COMPLETE WITH TRANSVAGINAL Result Date: 09/24/2024 CLINICAL DATA:  Heavy bleeding EXAM: TRANSABDOMINAL AND TRANSVAGINAL ULTRASOUND OF  PELVIS TECHNIQUE: Both transabdominal and transvaginal ultrasound examinations of the pelvis were performed. Transabdominal technique was performed for global imaging of the pelvis including uterus, ovaries, adnexal regions, and pelvic cul-de-sac. It was necessary to proceed with endovaginal exam following the transabdominal exam to visualize the uterus endometrium ovaries. COMPARISON:  CT 05/28/2024 FINDINGS: Uterus Measurements: 10.2 x 5.4 x 6 cm = volume: 171 mL. No fibroids or other mass visualized. Endometrium Thickness: 7 mm.  No focal abnormality visualized. Right ovary Measurements: 2.4 x 3.1 x 1.8 cm = volume: 7.1 mL. Normal appearance/no adnexal mass. Left ovary Measurements: 1.5 x 2.2 x 1.6 cm = volume: 2.8 mL. Normal appearance/no adnexal mass. Other findings No abnormal free fluid. IMPRESSION: Negative pelvic ultrasound. Electronically Signed   By: Luke Bun M.D.   On: 09/24/2024 02:09    Time coordinating discharge: 45 mins  SIGNED:  Norval Bar, MD [facility] 10/17/24, 11:08 AM     [1]  Allergies Allergen Reactions   Dimetapp Cold-Allergy [Brompheniramine-Phenylephrine ] Other (See Comments)    Eyes roll in back of head   Hydrocodone Hives and Nausea And Vomiting   Robitussin  (Alcohol Free) [Guaifenesin] Other (See Comments)    Eyes roll in back of head   Tramadol Hives and Rash   "

## 2024-10-17 NOTE — Discharge Instructions (Signed)
 Take medications as prescribed Follow up with PCP within one week

## 2024-10-17 NOTE — ED Notes (Signed)
 Pt reports her night was long and uncomfortable. Writer assisted pt with bagging all personal items, picking items up off floor, medicated for pain, assisting with IV placement to a more comfortable position. Pt was grateful. Pt call bell within reach. Pt encouraged to notify writer with any and all needs.

## 2024-10-17 NOTE — ED Notes (Signed)
 Pt was educated on discharge planning. Pt states she is concerned about not having anyone to cater to her needs while being so ill. Pt reassured that she is in fact sick but not appropriate for being in ER at this time. Writer advocated to pt that she would be able to return to ED for any emergent needs but at this time, her PO medications would be appropriate for home. Pt continued to escalate emotionally. Pt states she is unable to get a ride home at this time. Pt was then informed that she needed to be moved to hallway so ER pt would be able to be treated. Pt immediately called her spouse screaming that I am being kicked out of ER! No one is treating me right! They are refusing to medicate me for pain! . Writer reassured pt that she has just reported her pain level was at a tolerable 3/10 and all her needs were met. Pt reciprocated her appreciation and continued to rant to spouse. Charge RN made aware.

## 2024-10-18 LAB — CULTURE, BLOOD (ROUTINE X 2)
Culture: NO GROWTH
Culture: NO GROWTH

## 2024-10-18 LAB — URINE CULTURE: Culture: >=100000 — AB

## 2024-11-19 ENCOUNTER — Encounter: Admitting: Obstetrics & Gynecology
